# Patient Record
Sex: Female | Born: 1980 | Hispanic: Yes | Marital: Married | Smoking: Never smoker
Health system: Southern US, Community
[De-identification: ages and names within clinical notes are randomized; demographics above are authoritative.]

## PROBLEM LIST (undated history)

## (undated) DIAGNOSIS — E079 Disorder of thyroid, unspecified: Secondary | ICD-10-CM

## (undated) DIAGNOSIS — Z789 Other specified health status: Secondary | ICD-10-CM

## (undated) HISTORY — PX: NO PAST SURGERIES: SHX2092

## (undated) HISTORY — DX: Disorder of thyroid, unspecified: E07.9

## (undated) HISTORY — PX: TUBAL LIGATION: SHX77

---

## 2011-07-05 ENCOUNTER — Emergency Department: Payer: Self-pay | Admitting: Emergency Medicine

## 2016-03-05 ENCOUNTER — Encounter: Payer: Self-pay | Admitting: Emergency Medicine

## 2016-03-05 ENCOUNTER — Inpatient Hospital Stay
Admission: EM | Admit: 2016-03-05 | Discharge: 2016-03-16 | DRG: 872 | Disposition: A | Discharge status: Other Acute Inpatient Hospital | Payer: Medicaid Other | Attending: Internal Medicine | Admitting: Internal Medicine

## 2016-03-05 DIAGNOSIS — E872 Acidosis: Secondary | ICD-10-CM | POA: Diagnosis present

## 2016-03-05 DIAGNOSIS — D649 Anemia, unspecified: Secondary | ICD-10-CM | POA: Diagnosis present

## 2016-03-05 DIAGNOSIS — N390 Urinary tract infection, site not specified: Secondary | ICD-10-CM | POA: Diagnosis present

## 2016-03-05 DIAGNOSIS — N739 Female pelvic inflammatory disease, unspecified: Secondary | ICD-10-CM | POA: Diagnosis present

## 2016-03-05 DIAGNOSIS — K567 Ileus, unspecified: Secondary | ICD-10-CM | POA: Diagnosis present

## 2016-03-05 DIAGNOSIS — A419 Sepsis, unspecified organism: Principal | ICD-10-CM | POA: Diagnosis present

## 2016-03-05 DIAGNOSIS — B9620 Unspecified Escherichia coli [E. coli] as the cause of diseases classified elsewhere: Secondary | ICD-10-CM | POA: Diagnosis present

## 2016-03-05 DIAGNOSIS — R188 Other ascites: Secondary | ICD-10-CM | POA: Diagnosis present

## 2016-03-05 DIAGNOSIS — N888 Other specified noninflammatory disorders of cervix uteri: Secondary | ICD-10-CM

## 2016-03-05 DIAGNOSIS — R946 Abnormal results of thyroid function studies: Secondary | ICD-10-CM | POA: Diagnosis present

## 2016-03-05 DIAGNOSIS — R739 Hyperglycemia, unspecified: Secondary | ICD-10-CM | POA: Diagnosis present

## 2016-03-05 DIAGNOSIS — E871 Hypo-osmolality and hyponatremia: Secondary | ICD-10-CM | POA: Diagnosis present

## 2016-03-05 DIAGNOSIS — Z1612 Extended spectrum beta lactamase (ESBL) resistance: Secondary | ICD-10-CM | POA: Diagnosis present

## 2016-03-05 DIAGNOSIS — Z9851 Tubal ligation status: Secondary | ICD-10-CM | POA: Diagnosis not present

## 2016-03-05 DIAGNOSIS — Z1624 Resistance to multiple antibiotics: Secondary | ICD-10-CM | POA: Diagnosis present

## 2016-03-05 DIAGNOSIS — R1084 Generalized abdominal pain: Secondary | ICD-10-CM

## 2016-03-05 DIAGNOSIS — R109 Unspecified abdominal pain: Secondary | ICD-10-CM

## 2016-03-05 DIAGNOSIS — N10 Acute tubulo-interstitial nephritis: Secondary | ICD-10-CM | POA: Diagnosis present

## 2016-03-05 HISTORY — DX: Other specified health status: Z78.9

## 2016-03-05 LAB — CBC
HEMATOCRIT: 33.1 % — AB (ref 35.0–47.0)
HEMOGLOBIN: 11.5 g/dL — AB (ref 12.0–16.0)
MCH: 29.6 pg (ref 26.0–34.0)
MCHC: 34.7 g/dL (ref 32.0–36.0)
MCV: 85.2 fL (ref 80.0–100.0)
Platelets: 362 10*3/uL (ref 150–440)
RBC: 3.89 MIL/uL (ref 3.80–5.20)
RDW: 13.6 % (ref 11.5–14.5)
WBC: 22.3 10*3/uL — AB (ref 3.6–11.0)

## 2016-03-05 LAB — COMPREHENSIVE METABOLIC PANEL
ALT: 47 U/L (ref 14–54)
AST: 26 U/L (ref 15–41)
Albumin: 2.9 g/dL — ABNORMAL LOW (ref 3.5–5.0)
Alkaline Phosphatase: 106 U/L (ref 38–126)
Anion gap: 10 (ref 5–15)
BUN: 8 mg/dL (ref 6–20)
CHLORIDE: 100 mmol/L — AB (ref 101–111)
CO2: 25 mmol/L (ref 22–32)
Calcium: 8.5 mg/dL — ABNORMAL LOW (ref 8.9–10.3)
Creatinine, Ser: 0.89 mg/dL (ref 0.44–1.00)
Glucose, Bld: 146 mg/dL — ABNORMAL HIGH (ref 65–99)
POTASSIUM: 4 mmol/L (ref 3.5–5.1)
Sodium: 135 mmol/L (ref 135–145)
Total Bilirubin: 0.3 mg/dL (ref 0.3–1.2)
Total Protein: 7.3 g/dL (ref 6.5–8.1)

## 2016-03-05 LAB — URINALYSIS COMPLETE WITH MICROSCOPIC (ARMC ONLY)
Glucose, UA: NEGATIVE mg/dL
Ketones, ur: NEGATIVE mg/dL
Nitrite: POSITIVE — AB
PH: 6 (ref 5.0–8.0)
Specific Gravity, Urine: 1.028 (ref 1.005–1.030)

## 2016-03-05 LAB — POCT PREGNANCY, URINE: Preg Test, Ur: NEGATIVE

## 2016-03-05 LAB — LACTIC ACID, PLASMA: LACTIC ACID, VENOUS: 1.6 mmol/L (ref 0.5–1.9)

## 2016-03-05 LAB — LIPASE, BLOOD: LIPASE: 13 U/L (ref 11–51)

## 2016-03-05 LAB — MRSA PCR SCREENING: MRSA by PCR: NEGATIVE

## 2016-03-05 MED ORDER — ACETAMINOPHEN 650 MG RE SUPP
650.0000 mg | Freq: Four times a day (QID) | RECTAL | Status: DC | PRN
  Administered 2016-03-15: 650 mg via RECTAL
  Filled 2016-03-05: qty 1

## 2016-03-05 MED ORDER — DEXTROSE 5 % IV SOLN
1.0000 g | Freq: Once | INTRAVENOUS | Status: DC
  Filled 2016-03-05: qty 10

## 2016-03-05 MED ORDER — ENOXAPARIN SODIUM 40 MG/0.4ML ~~LOC~~ SOLN
40.0000 mg | SUBCUTANEOUS | Status: DC
  Administered 2016-03-05 – 2016-03-06 (×2): 40 mg via SUBCUTANEOUS
  Filled 2016-03-05 (×2): qty 0.4

## 2016-03-05 MED ORDER — SODIUM CHLORIDE 0.9% FLUSH
3.0000 mL | Freq: Two times a day (BID) | INTRAVENOUS | Status: DC
  Administered 2016-03-05 – 2016-03-15 (×11): 3 mL via INTRAVENOUS

## 2016-03-05 MED ORDER — DEXTROSE 5 % IV SOLN
2.0000 g | Freq: Once | INTRAVENOUS | Status: AC
  Administered 2016-03-05: 2 g via INTRAVENOUS
  Filled 2016-03-05: qty 2

## 2016-03-05 MED ORDER — ACETAMINOPHEN 325 MG PO TABS: 650.0000 mg | ORAL_TABLET | Freq: Four times a day (QID) | ORAL | Status: DC | PRN

## 2016-03-05 MED ORDER — SODIUM CHLORIDE 0.9 % IV SOLN
INTRAVENOUS | Status: DC
  Administered 2016-03-05: 19:00:00 via INTRAVENOUS

## 2016-03-05 MED ORDER — OXYCODONE HCL 5 MG PO TABS
5.0000 mg | ORAL_TABLET | ORAL | Status: DC | PRN
  Administered 2016-03-05 – 2016-03-15 (×16): 5 mg via ORAL
  Filled 2016-03-05 (×17): qty 1

## 2016-03-05 MED ORDER — SODIUM CHLORIDE 0.9 % IV BOLUS (SEPSIS)
500.0000 mL | Freq: Once | INTRAVENOUS | Status: AC
  Administered 2016-03-05: 500 mL via INTRAVENOUS

## 2016-03-05 MED ORDER — KETOROLAC TROMETHAMINE 30 MG/ML IJ SOLN
15.0000 mg | Freq: Once | INTRAMUSCULAR | Status: AC
  Administered 2016-03-05: 15 mg via INTRAVENOUS
  Filled 2016-03-05: qty 1

## 2016-03-05 MED ORDER — DEXTROSE 5 % IV SOLN
1.0000 g | INTRAVENOUS | Status: DC
  Filled 2016-03-05: qty 10

## 2016-03-05 MED ORDER — MORPHINE SULFATE (PF) 2 MG/ML IV SOLN
2.0000 mg | INTRAVENOUS | Status: DC | PRN
  Administered 2016-03-05 – 2016-03-16 (×18): 2 mg via INTRAVENOUS
  Filled 2016-03-05 (×19): qty 1

## 2016-03-05 MED ORDER — SODIUM CHLORIDE 0.9 % IV BOLUS (SEPSIS)
1000.0000 mL | Freq: Once | INTRAVENOUS | Status: AC
  Administered 2016-03-05: 1000 mL via INTRAVENOUS

## 2016-03-05 NOTE — Progress Notes (Signed)
Pharmacy Antibiotic Note  Pamela [name] is a 35 y.o. female admitted on 03/05/2016 with sepsis/UTI.  Pharmacy has been consulted for ceftriaxone dosing.  Plan: Given 2g of Ceftriaxone x1 in the ED. Will order ceftriaxone 1g Q24hr for possibly UTI Sepsis. Re-evaluate to de-escalate abx therapy when appropriate.   Height: 5\' 2"  (157.5 cm) Weight: 110 lb (49.9 kg) IBW/kg (Calculated) : 50.1  Temp (24hrs), Avg:98.6 F (37 C), Min:98.6 F (37 C), Max:98.6 F (37 C)   Recent Labs Lab 03/05/16 1710  WBC 22.3*  CREATININE 0.89    Estimated Creatinine Clearance: 68.8 mL/min (by C-G formula based on SCr of 0.89 mg/dL).    No Known Allergies  Antimicrobials this admission: Ceftriaxone 8/22 >>    Microbiology results: 8/22 BCx: in process 8/22 UCx: in process  Thank you for allowing pharmacy to be a part of this patient's care.  Delsa BernKelly m Orpha Dain 03/05/2016 6:44 PM

## 2016-03-05 NOTE — ED Triage Notes (Signed)
States lower abdominal pain, nausea and vomiting but no diarrhea x 9 days. Denies fevers. States does have pain with urination. Denies being pregnant.

## 2016-03-05 NOTE — H&P (Signed)
[facility] - [place] at [facility]   PATIENT NAME: Pamela [name]    MR#:  [id number]  DATE OF BIRTH:  01/19/1981   DATE OF ADMISSION:  03/05/2016  PRIMARY CARE PHYSICIAN: No PCP Per Patient   REQUESTING/REFERRING PHYSICIAN: Inocencio HomesGayle  CHIEF COMPLAINT:   Chief Complaint  Patient presents with  . Abdominal Pain    HISTORY OF PRESENT ILLNESS:  Pamela [name]  is a 35 y.o. female Spanish-speaking female without significant prior medical history presenting with abdominal pain and fever. She states that she's been having abdominal pain suprapubic in location described only as pain intensity 8/10 no worsening or relieving factors nonradiating associated dysuria as well as fevers, chills, nausea vomiting of nonbloody nonbilious emesis, denies any diarrhea or constipation. Emergency Department patient meeting septic criteria code sepsis initiated  PAST MEDICAL HISTORY:   Past Medical History:  Diagnosis Date  . Medical history non-contributory     PAST SURGICAL HISTORY:   Past Surgical History:  Procedure Laterality Date  . NO PAST SURGERIES      SOCIAL HISTORY:   Social History  Substance Use Topics  . Smoking status: Never Smoker  . Smokeless tobacco: Never Used  . Alcohol use No    FAMILY HISTORY:   Family History  Problem Relation Age of Onset  . Diabetes Neg Hx     DRUG ALLERGIES:  No Known Allergies  REVIEW OF SYSTEMS:  REVIEW OF SYSTEMS:  CONSTITUTIONAL:Positive fevers, chills, fatigue, weakness.  EYES: Denies blurred vision, double vision, or eye pain.  EARS, NOSE, THROAT: Denies tinnitus, ear pain, hearing loss.  RESPIRATORY: denies cough, shortness of breath, wheezing  CARDIOVASCULAR: Denies chest pain, palpitations, edema.  GASTROINTESTINAL: Denies nausea, vomiting, diarrhea, abdominal pain.  GENITOURINARY:Positive dysuria , denies hematuria.  ENDOCRINE: Denies nocturia or thyroid problems. HEMATOLOGIC AND LYMPHATIC:  Denies easy bruising or bleeding.  SKIN: Denies rash or lesions.  MUSCULOSKELETAL: Denies pain in neck, back, shoulder, knees, hips, or further arthritic symptoms.  NEUROLOGIC: Denies paralysis, paresthesias.  PSYCHIATRIC: Denies anxiety or depressive symptoms. Otherwise full review of systems performed by me is negative.   MEDICATIONS AT HOME:   Prior to Admission medications   Not on File      VITAL SIGNS:  Blood pressure (!) 90/58, pulse (!) 117, temperature 98.6 F (37 C), temperature source Oral, resp. rate 20, height 5\' 2"  (1.575 m), weight 49.9 kg (110 lb), last menstrual period 02/05/2016, SpO2 100 %.  PHYSICAL EXAMINATION:  VITAL SIGNS: Vitals:   03/05/16 1706  BP: (!) 90/58  Pulse: (!) 117  Resp: 20  Temp: 98.6 F (37 C)   GENERAL:35 y.o.female currently ill appearing HEAD: Normocephalic, atraumatic.  EYES: Pupils equal, round, reactive to light. Extraocular muscles intact. No scleral icterus.  MOUTH: Moist mucosal membrane. Dentition intact. No abscess noted.  EAR, NOSE, THROAT: Clear without exudates. No external lesions.  NECK: Supple. No thyromegaly. No nodules. No JVD.  PULMONARY: Clear to ascultation, without wheeze rails or rhonci. No use of accessory muscles, Good respiratory effort. good air entry bilaterally CHEST: Nontender to palpation.  CARDIOVASCULAR: S1 and S2. tachycardic. No murmurs, rubs, or gallops. No edema. Pedal pulses 2+ bilaterally.  GASTROINTESTINAL: Soft, suprapubic tenderness without reboud, nondistended. No masses. Positive bowel sounds. No hepatosplenomegaly.  MUSCULOSKELETAL: No swelling, clubbing, or edema. Range of motion full in all extremities.  NEUROLOGIC: Cranial nerves II through XII are intact. No gross focal neurological deficits. Sensation intact. Reflexes intact.  SKIN: No ulceration,  lesions, rashes, or cyanosis. Skin warm and dry. Turgor intact.  PSYCHIATRIC: Mood, affect within normal limits. The patient is awake, alert  and oriented x 3. Insight, judgment intact.    LABORATORY PANEL:   CBC  Recent Labs Lab 03/05/16 1710  WBC 22.3*  HGB 11.5*  HCT 33.1*  PLT 362   ------------------------------------------------------------------------------------------------------------------  Chemistries   Recent Labs Lab 03/05/16 1710  NA 135  K 4.0  CL 100*  CO2 25  GLUCOSE 146*  BUN 8  CREATININE 0.89  CALCIUM 8.5*  AST 26  ALT 47  ALKPHOS 106  BILITOT 0.3   ------------------------------------------------------------------------------------------------------------------  Cardiac Enzymes No results for input(s): TROPONINI in the last 168 hours. ------------------------------------------------------------------------------------------------------------------  RADIOLOGY:  No results found.  EKG:  No orders found for this or any previous visit.  IMPRESSION AND PLAN:   35 year old Hispanic female without significant medical history presenting with abdominal pain and fever  1.Sepsis, meeting septic criteria by temperature, leukocytosis, heart rate present on arrival. Source urinary tract infection Code sepsis initiated. Panculture. Broad-spectrum antibiotics including ceftriaxone and taper antibiotics when culture data returns.  Has received a 30 mL/kg IV fluid bolus. Continue IV fluid hydration to keep mean arterial pressure greater than 65. may require pressor therapy if blood pressure worsens. We will repeat lactic acid if the initial is greater than 2.2.   Given tenuous blood pressure admit to stepdown, I suspect her blood pressure is normally on the lower side as she is a smaller individual  All the records are reviewed and case discussed with ED provider. Management plans discussed with the patient, family and they are in agreement.  CODE STATUS: Full  TOTAL TIME TAKING CARE OF THIS PATIENT: 33 minutes.    Sherie Dobrowolski,  Mardi MainlandDavid K M.D on 03/05/2016 at 6:49 PM  Between 7am to 6pm - Pager -  [phone]  After 6pm: House Pager: - [phone]  [facility] [place] [facility]  Office  [phone]  CC: Primary care physician; No PCP Per Patient

## 2016-03-05 NOTE — ED Notes (Signed)
Code  Sepsis  Called  To  Doug  At  [facility] 

## 2016-03-05 NOTE — ED Provider Notes (Signed)
Pamela Pamela [name] 03/05/16 1752     (approximate)  I have reviewed the triage vital signs and the nursing notes.   HISTORY  Chief Complaint Abdominal Pain    HPI Pamela Pamela [name] is a 35 y.o. female with no chronic medical problems who presents for evaluation of 9 days of suprapubic abdominal pain, dysuria and urinary hesitancy, change in the color of her urine as well as subjective fevers, chills, nausea and vomiting, gradual onset, constant, severe, no modifying factors. She has had a small amount of nonbloody diarrhea. No chest pain or difficulty breathing. She denies any hematuria.    Past Medical History:  Diagnosis Date  . Medical history non-contributory     Pamela [name] Active Problem List   Diagnosis Date Noted  . Sepsis (HCC) 03/05/2016    Past Surgical History:  Procedure Laterality Date  . NO PAST SURGERIES      Prior to Admission medications   Not on File    Allergies Review of Pamela [name] allergies indicates no known allergies.  Family History  Problem Relation Age of Onset  . Diabetes Neg Hx     Social History Social History  Substance Use Topics  . Smoking status: Never Smoker  . Smokeless tobacco: Never Used  . Alcohol use No    Review of Systems Constitutional: + fever/chills Eyes: No visual changes. ENT: No sore throat. Cardiovascular: Denies chest pain. Respiratory: Denies shortness of breath. Gastrointestinal: + abdominal pain.  + nausea, + vomiting.  + diarrhea.  No constipation. Genitourinary: Negative for dysuria. Musculoskeletal: Negative for back pain. Skin: Negative for rash. Neurological: Negative for headaches, focal weakness or numbness.  10-point ROS otherwise negative.  ____________________________________________   PHYSICAL EXAM:   VITAL SIGNS: ED Triage Vitals  Enc  Vitals Group     BP 03/05/16 1706 (!) 90/58     Pulse Rate 03/05/16 1706 (!) 117     Resp 03/05/16 1706 20     Temp 03/05/16 1706 98.6 F (37 C)     Temp Source 03/05/16 1706 Oral     SpO2 03/05/16 1706 100 %     Weight 03/05/16 1707 110 lb (49.9 kg)     Height 03/05/16 1707 5\' 2"  (1.575 m)     Head Circumference --      Peak Flow --      Pain Score 03/05/16 1707 10     Pain Loc --      Pain Edu? --      Excl. in GC? --     Constitutional: Alert and oriented. Fatigued but nontoxic-appearing and in no acute distress. Eyes: Conjunctivae are normal. PERRL. EOMI. Head: Atraumatic. Nose: No congestion/rhinnorhea. Mouth/Throat: Mucous membranes are moist.  Oropharynx non-erythematous. Neck: No stridor. Supple without meningismus. Cardiovascular: Tachycardic rate, regular rhythm. Grossly normal heart sounds.  Good peripheral circulation. Respiratory: Normal respiratory effort.  No retractions. Lungs CTAB. Gastrointestinal: Soft with mild superpubic tenderness to palpation, no rebound or guarding. No CVA tenderness. Genitourinary: deferred Musculoskeletal: No lower extremity tenderness nor edema.  No joint effusions. Neurologic:  Normal speech and language. No gross focal neurologic deficits are appreciated. No gait instability. Skin:  Skin is warm, dry and intact. No rash noted. Psychiatric: Mood and affect are normal. Speech and behavior are normal.  ____________________________________________   LABS (all labs ordered are listed, but only abnormal results are displayed)  Labs Reviewed  COMPREHENSIVE  METABOLIC PANEL - Abnormal; Notable for the following:       Result Value   Chloride 100 (*)    Glucose, Bld 146 (*)    Calcium 8.5 (*)    Albumin 2.9 (*)    All other components within normal limits  CBC - Abnormal; Notable for the following:    WBC 22.3 (*)    Hemoglobin 11.5 (*)    HCT 33.1 (*)    All other components within normal limits  URINALYSIS COMPLETEWITH  MICROSCOPIC ([facility] ONLY) - Abnormal; Notable for the following:    Color, Urine AMBER (*)    APPearance CLOUDY (*)    Bilirubin Urine 1+ (*)    Hgb urine dipstick 1+ (*)    Protein, ur >500 (*)    Nitrite POSITIVE (*)    Leukocytes, UA 1+ (*)    Bacteria, UA RARE (*)    Squamous Epithelial / LPF 6-30 (*)    All other components within normal limits  MRSA PCR SCREENING  CULTURE, BLOOD (ROUTINE X 2)  CULTURE, BLOOD (ROUTINE X 2)  URINE CULTURE  LIPASE, BLOOD  LACTIC ACID, PLASMA  BASIC METABOLIC PANEL  CBC  POC URINE PREG, ED  POCT PREGNANCY, URINE   ____________________________________________  EKG  none ____________________________________________  RADIOLOGY  none ____________________________________________   PROCEDURES  Procedure(s) performed: None  Procedures  Critical Care performed: Yes, see critical care note(s).  CRITICAL CARE Performed by: Toney RakesGayle, Enola Siebers A   Total critical care time: 35 minutes  Critical care time was exclusive of separately billable procedures and treating other patients.  Critical care was necessary to treat or prevent imminent or life-threatening deterioration.  Critical care was time spent personally by me on the following activities: development of treatment plan with Pamela [name] and/or surrogate as well as nursing, discussions with consultants, evaluation of Pamela [name] response to treatment, examination of Pamela [name], obtaining history from Pamela [name] or surrogate, ordering and performing treatments and interventions, ordering and review of laboratory studies, ordering and review of radiographic studies, pulse oximetry and re-evaluation of Pamela [name] condition.  ____________________________________________   INITIAL IMPRESSION / ASSESSMENT AND PLAN / ED COURSE  Pertinent labs & imaging results that were available during my care of the Pamela [name] were reviewed by me and considered in my medical decision making (see chart for details).  [facility] Hospital[facility]  [place] Pamela [name] is a 35 y.o. female with no chronic medical problems who presents for evaluation of 9 days of suprapubic abdominal pain, dysuria and urinary hesitancy as well as fevers and chills. On exam she is fatigued but nontoxic-appearing. She is tachycardic with leukocytosis, white blood cell count elevated at 22,000 meeting 2 out of 4 Sirs criteria. Urinalysis is concerning for nitrite positive urinary tract infection. Concern for UTI sepsis, code sepsis initiated, will give liberal IV fluids, ceftriaxone and discussed the case with the hospitalist for admission.  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 I discussed the case with the hospitalist for admission at this time.  Clinical Course     ____________________________________________   FINAL CLINICAL IMPRESSION(S) / ED DIAGNOSES  Final diagnoses:  Sepsis secondary to UTI (HCC)      NEW MEDICATIONS STARTED DURING THIS VISIT:  There are no discharge medications for this Pamela [name].    Note:  This document was prepared using Conservation officer, historic buildingsDragon voice recognition software and  may include unintentional dictation errors.    Gayla DossEryka A Ahlaya Ende, MD 03/05/16 2352

## 2016-03-06 ENCOUNTER — Inpatient Hospital Stay: Payer: Medicaid Other

## 2016-03-06 ENCOUNTER — Encounter: Payer: Self-pay | Admitting: Radiology

## 2016-03-06 LAB — CBC
HCT: 30.3 % — ABNORMAL LOW (ref 35.0–47.0)
Hemoglobin: 10.5 g/dL — ABNORMAL LOW (ref 12.0–16.0)
MCH: 29.6 pg (ref 26.0–34.0)
MCHC: 34.6 g/dL (ref 32.0–36.0)
MCV: 85.7 fL (ref 80.0–100.0)
PLATELETS: 318 10*3/uL (ref 150–440)
RBC: 3.54 MIL/uL — ABNORMAL LOW (ref 3.80–5.20)
RDW: 13.8 % (ref 11.5–14.5)
WBC: 25.7 10*3/uL — AB (ref 3.6–11.0)

## 2016-03-06 LAB — GLUCOSE, CAPILLARY: Glucose-Capillary: 127 mg/dL — ABNORMAL HIGH (ref 65–99)

## 2016-03-06 LAB — BASIC METABOLIC PANEL
Anion gap: 6 (ref 5–15)
BUN: 8 mg/dL (ref 6–20)
CHLORIDE: 109 mmol/L (ref 101–111)
CO2: 21 mmol/L — AB (ref 22–32)
CREATININE: 0.7 mg/dL (ref 0.44–1.00)
Calcium: 7.1 mg/dL — ABNORMAL LOW (ref 8.9–10.3)
GFR calc Af Amer: >60 mL/min (ref >60)
GFR calc non Af Amer: >60 mL/min (ref >60)
GLUCOSE: 127 mg/dL — AB (ref 65–99)
Potassium: 3.8 mmol/L (ref 3.5–5.1)
SODIUM: 136 mmol/L (ref 135–145)

## 2016-03-06 LAB — HEMOGLOBIN A1C: Hgb A1c MFr Bld: 5.2 % (ref 4.0–6.0)

## 2016-03-06 MED ORDER — DEXTROSE 5 % IV SOLN
2.0000 g | INTRAVENOUS | Status: DC
  Filled 2016-03-06: qty 2

## 2016-03-06 MED ORDER — DEXTROSE 5 % IV SOLN: 1.0000 g | INTRAVENOUS | Status: DC

## 2016-03-06 MED ORDER — SODIUM CHLORIDE 0.9 % IV SOLN
1.0000 g | Freq: Three times a day (TID) | INTRAVENOUS | Status: DC
  Administered 2016-03-06 – 2016-03-16 (×29): 1 g via INTRAVENOUS
  Filled 2016-03-06 (×33): qty 1

## 2016-03-06 MED ORDER — IOPAMIDOL (ISOVUE-300) INJECTION 61%
100.0000 mL | Freq: Once | INTRAVENOUS | Status: AC | PRN
Start: 2016-03-06 — End: 2016-03-06
  Administered 2016-03-06: 100 mL via INTRAVENOUS

## 2016-03-06 MED ORDER — SODIUM CHLORIDE 0.9 % IV BOLUS (SEPSIS)
1000.0000 mL | Freq: Once | INTRAVENOUS | Status: AC
  Administered 2016-03-06: 1000 mL via INTRAVENOUS

## 2016-03-06 MED ORDER — POTASSIUM CHLORIDE IN NACL 20-0.9 MEQ/L-% IV SOLN
INTRAVENOUS | Status: DC
  Administered 2016-03-06 – 2016-03-16 (×22): via INTRAVENOUS
  Filled 2016-03-06 (×32): qty 1000

## 2016-03-06 MED ORDER — DOXYCYCLINE HYCLATE 100 MG PO TABS
100.0000 mg | ORAL_TABLET | Freq: Two times a day (BID) | ORAL | Status: DC
  Administered 2016-03-07 (×2): 100 mg via ORAL
  Filled 2016-03-06 (×2): qty 1

## 2016-03-06 MED ORDER — DIATRIZOATE MEGLUMINE & SODIUM 66-10 % PO SOLN
15.0000 mL | ORAL | Status: AC
  Administered 2016-03-06 (×2): 15 mL via ORAL

## 2016-03-06 MED ORDER — STERILE WATER FOR INJECTION IV SOLN
INTRAVENOUS | Status: DC
  Administered 2016-03-06 – 2016-03-11 (×5): via INTRAVENOUS
  Filled 2016-03-06 (×12): qty 850

## 2016-03-06 NOTE — Consult Note (Addendum)
Obstetrics & Gynecology Consult H&P    Consulting Department: Internal Medicine  Consulting Physician: Theodoro Grist MD  Consulting Question: Evaluate for PID   History of Present Illness: Patient is a 35 y.o. currently admitted to the ICU meeting SIRS criteria.  The patient presented to the ED with fevers, abdominal pain located in the suprapubic area, 8/10 in intensity, radiating to the flanks.  Symptoms initially started on 02/26/2016 and progressively worsened.  LMP is 02/05/2016, regular monthly menses.  Not currently sexually active, last intercourse approximately 2 months ago.  No history of gonorrhea or chlamydia.   Does have a history of abnormal paps, was told these would normalize over time (obtained at [facility]).  Patient is also status post prior tubal ligation.  She was previously treated for pyelonephritis at [facility] (E. Coli) in April of 2014.  Review of Systems:10 point review of systems  Past Medical History:  Past Medical History:  Diagnosis Date  . Medical history non-contributory     Past Surgical History:  Past Surgical History:  Procedure Laterality Date  . NO PAST SURGERIES      Gynecologic History: Unclear history of prior abnormal paps, denies history of STI  Obstetric History: G2P2002 TSVD x 2  Family History:  Family History  Problem Relation Age of Onset  . Diabetes Neg Hx     Social History:  Social History   Social History  . Marital status: Married    Spouse name: N/A  . Number of children: N/A  . Years of education: N/A   Occupational History  . Not on file.   Social History Main Topics  . Smoking status: Never Smoker  . Smokeless tobacco: Never Used  . Alcohol use No  . Drug use: Unknown  . Sexual activity: Not on file   Other Topics Concern  . Not on file   Social History Narrative  . No narrative on file    Allergies:  No Known Allergies  Medications: Prior to Admission medications   Not on File     Physical Exam Vitals: Blood pressure 98/73, pulse 96, temperature 98.5 F (36.9 C), temperature source Oral, resp. rate 20, height 5' 2"  (1.575 m), weight 54.4 kg (119 lb 14.9 oz), last menstrual period 02/05/2016, SpO2 90 %. General: NAD HEENT: normocephalic, anicteric Pulmonary: no increased work of breathing Cardiovascular: RRR Abdomen: Suprapubic tenderness Genitourinary: Normal external female genitalia, bimanual exam reveals significant enlargement and firmness to the lower uterine segment and proximal cervix.  There is no appreciable mobility. Extremities: no edema Neurologic: grossly intact Psychiatric: A&O, mood appropriate, affect full  Labs: Results for orders placed or performed during the hospital encounter of 03/05/16 (from the past 72 hour(s))  Lipase, blood     Status: None   Collection Time: 03/05/16  5:10 PM  Result Value Ref Range   Lipase 13 11 - 51 U/L  Comprehensive metabolic panel     Status: Abnormal   Collection Time: 03/05/16  5:10 PM  Result Value Ref Range   Sodium 135 135 - 145 mmol/L   Potassium 4.0 3.5 - 5.1 mmol/L   Chloride 100 (L) 101 - 111 mmol/L   CO2 25 22 - 32 mmol/L   Glucose, Bld 146 (H) 65 - 99 mg/dL   BUN 8 6 - 20 mg/dL   Creatinine, Ser 0.89 0.44 - 1.00 mg/dL   Calcium 8.5 (L) 8.9 - 10.3 mg/dL   Total Protein 7.3 6.5 - 8.1 g/dL   Albumin 2.9 (  L) 3.5 - 5.0 g/dL   AST 26 15 - 41 U/L   ALT 47 14 - 54 U/L   Alkaline Phosphatase 106 38 - 126 U/L   Total Bilirubin 0.3 0.3 - 1.2 mg/dL   GFR calc non Af Amer >60 >60 mL/min   GFR calc Af Amer >60 >60 mL/min    Comment: (NOTE) The eGFR has been calculated using the CKD EPI equation. This calculation has not been validated in all clinical situations. eGFR's persistently <60 mL/min signify possible Chronic Kidney Disease.    Anion gap 10 5 - 15  CBC     Status: Abnormal   Collection Time: 03/05/16  5:10 PM  Result Value Ref Range   WBC 22.3 (H) 3.6 - 11.0 K/uL   RBC 3.89 3.80 -  5.20 MIL/uL   Hemoglobin 11.5 (L) 12.0 - 16.0 g/dL   HCT 33.1 (L) 35.0 - 47.0 %   MCV 85.2 80.0 - 100.0 fL   MCH 29.6 26.0 - 34.0 pg   MCHC 34.7 32.0 - 36.0 g/dL   RDW 13.6 11.5 - 14.5 %   Platelets 362 150 - 440 K/uL  Urinalysis complete, with microscopic     Status: Abnormal   Collection Time: 03/05/16  5:10 PM  Result Value Ref Range   Color, Urine AMBER (A) YELLOW   APPearance CLOUDY (A) CLEAR   Glucose, UA NEGATIVE NEGATIVE mg/dL   Bilirubin Urine 1+ (A) NEGATIVE   Ketones, ur NEGATIVE NEGATIVE mg/dL   Specific Gravity, Urine 1.028 1.005 - 1.030   Hgb urine dipstick 1+ (A) NEGATIVE   pH 6.0 5.0 - 8.0   Protein, ur >500 (A) NEGATIVE mg/dL   Nitrite POSITIVE (A) NEGATIVE   Leukocytes, UA 1+ (A) NEGATIVE   RBC / HPF 6-30 0 - 5 RBC/hpf   WBC, UA TOO NUMEROUS TO COUNT 0 - 5 WBC/hpf   Bacteria, UA RARE (A) NONE SEEN   Squamous Epithelial / LPF 6-30 (A) NONE SEEN  Urine culture     Status: Abnormal (Preliminary result)   Collection Time: 03/05/16  5:10 PM  Result Value Ref Range   Specimen Description URINE, CLEAN CATCH    Special Requests NONE    Culture >=100,000 COLONIES/mL GRAM NEGATIVE RODS (A)    Report Status PENDING   Pregnancy, urine POC     Status: None   Collection Time: 03/05/16  5:20 PM  Result Value Ref Range   Preg Test, Ur NEGATIVE NEGATIVE    Comment:        THE SENSITIVITY OF THIS METHODOLOGY IS >24 mIU/mL   Lactic acid, plasma     Status: None   Collection Time: 03/05/16  5:53 PM  Result Value Ref Range   Lactic Acid, Venous 1.6 0.5 - 1.9 mmol/L  Blood culture (routine x 2)     Status: None (Preliminary result)   Collection Time: 03/05/16  6:10 PM  Result Value Ref Range   Specimen Description BLOOD LEFT ARM    Special Requests      BOTTLES DRAWN AEROBIC AND ANAEROBIC AER 5CC,ANA 4CC   Culture NO GROWTH < 12 HOURS    Report Status PENDING   Blood culture (routine x 2)     Status: None (Preliminary result)   Collection Time: 03/05/16  6:20 PM   Result Value Ref Range   Specimen Description BLOOD LEFT AC    Special Requests BOTTLES DRAWN AEROBIC AND ANAEROBIC AER 4CC,ANA5CC    Culture NO GROWTH < 12  HOURS    Report Status PENDING   MRSA PCR Screening     Status: None   Collection Time: 03/05/16  8:05 PM  Result Value Ref Range   MRSA by PCR NEGATIVE NEGATIVE    Comment:        The GeneXpert MRSA Assay (FDA approved for NASAL specimens only), is one component of a comprehensive MRSA colonization surveillance program. It is not intended to diagnose MRSA infection nor to guide or monitor treatment for MRSA infections.   Glucose, capillary     Status: Abnormal   Collection Time: 03/05/16  8:05 PM  Result Value Ref Range   Glucose-Capillary 127 (H) 65 - 99 mg/dL  Basic metabolic panel     Status: Abnormal   Collection Time: 03/06/16  4:36 AM  Result Value Ref Range   Sodium 136 135 - 145 mmol/L   Potassium 3.8 3.5 - 5.1 mmol/L   Chloride 109 101 - 111 mmol/L   CO2 21 (L) 22 - 32 mmol/L   Glucose, Bld 127 (H) 65 - 99 mg/dL   BUN 8 6 - 20 mg/dL   Creatinine, Ser 0.70 0.44 - 1.00 mg/dL   Calcium 7.1 (L) 8.9 - 10.3 mg/dL   GFR calc non Af Amer >60 >60 mL/min   GFR calc Af Amer >60 >60 mL/min    Comment: (NOTE) The eGFR has been calculated using the CKD EPI equation. This calculation has not been validated in all clinical situations. eGFR's persistently <60 mL/min signify possible Chronic Kidney Disease.    Anion gap 6 5 - 15  CBC     Status: Abnormal   Collection Time: 03/06/16  4:36 AM  Result Value Ref Range   WBC 25.7 (H) 3.6 - 11.0 K/uL   RBC 3.54 (L) 3.80 - 5.20 MIL/uL   Hemoglobin 10.5 (L) 12.0 - 16.0 g/dL   HCT 30.3 (L) 35.0 - 47.0 %   MCV 85.7 80.0 - 100.0 fL   MCH 29.6 26.0 - 34.0 pg   MCHC 34.6 32.0 - 36.0 g/dL   RDW 13.8 11.5 - 14.5 %   Platelets 318 150 - 440 K/uL  Hemoglobin A1c     Status: None   Collection Time: 03/06/16  4:36 AM  Result Value Ref Range   Hgb A1c MFr Bld 5.2 4.0 - 6.0 %     Imaging US Renal  Result Date: 03/06/2016 CLINICAL DATA:  Acute pyelonephritis. EXAM: RENAL / URINARY TRACT ULTRASOUND COMPLETE COMPARISON:  None. FINDINGS: Right Kidney: Length: 10.3 cm. Echogenicity within normal limits. No mass or hydronephrosis visualized. Left Kidney: Length: 9.2 cm. Echogenicity within normal limits. No mass or hydronephrosis visualized. Bladder: Appears normal for degree of bladder distention. Bilateral ureteral jets are noted. Small amount of ascites is noted around the liver and spleen. IMPRESSION: Small amount of ascites is noted. Kidneys appear grossly normal without hydronephrosis or renal obstruction. Electronically Signed   By: Marijo Conception, M.D.   On: 03/06/2016 12:07    Assessment: 35 y.o. [id number] with pylonephritis, non-mobile enlarged lower uterine segment  Plan: 1) SIRS criteria - feel most likely source is acute pyelonephritis given UA findingswith positive nitrites and culture showing gram negative rods.  Speciation pending anticipate E. Coli.  Does have history of previous pyelonephritis at [facility] in April of 2014. - currently on ceftriaxone consider could consider adding gentamycin for synergy if fails to improve clinically - based on clear source, no risk factors for PID (prior tubal eliminating pathway for  ascending infection), I feel PID is low on the differential.  Will send urine gonorrhea and chlamydia cultures.   - If fails to clinically improve consider CT abdomen and pelvis  2) Enlargement of lower uterine segment/proximal cervix - non-mobile on exam, has history of prior abnormal paps although unclear what the abnormality was.  She was told this would resolve in time.  Concern for possible cervical or uterine pathology.  No evidence of hydronephrosis on ultrasound. - gyn ultrasound abdominal and transvaginal

## 2016-03-06 NOTE — Care Management (Signed)
Met with patient and her daughter that speaks fluent English (patient speaks Spanish) to discuss discharge planning. Patient states she has not been able to work for 2 weeks now due to this pain. She states she has a primary care physician but cannot recall their name. She states she cannot afford her medications due to being out of work. I have delivered and explained application/location of both [facility] and [facility]. Patient does not have have health insurance and will need hard copies of Rx to take to med mgt for assistance. RNCM to continue to follow. Patient states she is usually independent will mobility and has transportation.

## 2016-03-06 NOTE — Progress Notes (Signed)
Dr Clint GuyHower notified of ABD CT findings, no changes to orders at this time

## 2016-03-06 NOTE — Progress Notes (Addendum)
Checked to see if GC/CT has resulted and CT abdomen and pelvis results reviewed.  Significant abscess pockets within pelvis and small/rectum bowl.  Until GC/CT results would broaden coverage by adding doxycyline.  Outpatient PID is treated with ceftriaxone and doxycyline.  Preferred inpatient treatment usually involves cefotetan or cefoxitin as preferred cephalosporin.  I still think that given her history of prior tubal ligation ascending infection is relatively unlikely.  TOA also tend to be confined more prominently to fallopian tubes themselves.  Given this I would keep appendicitis on the differential particularly as the abscess collection appears to be most prominent in the right lower quadrant with largest pocket superior to the right adnexa,  and consider general surgery consult.  On broad coverage with meropenem currently

## 2016-03-06 NOTE — Progress Notes (Signed)
Pharmacy Antibiotic Note  Pamela [name] is a 35 y.o. female admitted on 03/05/2016 with sepsis/UTI.  Pharmacy has been consulted for ceftriaxone dosing.  Plan: Ceftriaxone 2 g iv q 24 hours.   Height: 5\' 2"  (157.5 cm) Weight: 119 lb 14.9 oz (54.4 kg) IBW/kg (Calculated) : 50.1  Temp (24hrs), Avg:98.7 F (37.1 C), Min:98 F (36.7 C), Max:99.3 F (37.4 C)   Recent Labs Lab 03/05/16 1710 03/05/16 1753 03/06/16 0436  WBC 22.3*  --  25.7*  CREATININE 0.89  --  0.70  LATICACIDVEN  --  1.6  --     Estimated Creatinine Clearance: 76.9 mL/min (by C-G formula based on SCr of 0.8 mg/dL).    No Known Allergies  Antimicrobials this admission: Ceftriaxone 8/22 >>    Microbiology results: 8/22 BCx: NGTD x 2 8/22 UCx: pending  8/22 MRSA PCR: negative  Thank you for allowing pharmacy to be a part of this patient's care.  Luisa HartChristy, Brea Coleson D 03/06/2016 9:07 AM

## 2016-03-06 NOTE — Progress Notes (Signed)
[facility] [place] [facility]   PATIENT NAME: Pamela NovaMaria Del [place] [name]    MR#:  [id number]  DATE OF BIRTH:  11/19/1980  SUBJECTIVE:  CHIEF COMPLAINT:   Chief Complaint  Patient presents with  . Abdominal Pain  Patient is a 35 year old Spanish female with not significant past medical history who presents to the hospital with complaints of abdominal pain and fever. Pain is described as 8 out of 10 by intensity and suprapubic area, associated with dysuria, nausea, vomiting, fevers and chills, she also admits of flank pain bilaterally, more on the left side. Her urinalysis revealed pyuria, concerning for acute pyelonephritis and patient was admitted for IV antibiotics. Ultrasound of the kidneys was unremarkable. But mild ascites.  Review of Systems  Constitutional: Negative for chills, fever and weight loss.  HENT: Negative for congestion.   Eyes: Negative for blurred vision and double vision.  Respiratory: Negative for cough, sputum production, shortness of breath and wheezing.   Cardiovascular: Negative for chest pain, palpitations, orthopnea, leg swelling and PND.  Gastrointestinal: Positive for abdominal pain, nausea and vomiting. Negative for blood in stool, constipation and diarrhea.  Genitourinary: Positive for flank pain. Negative for dysuria, frequency, hematuria and urgency.  Musculoskeletal: Negative for falls.  Neurological: Negative for dizziness, tremors, focal weakness and headaches.  Endo/Heme/Allergies: Does not bruise/bleed easily.  Psychiatric/Behavioral: Negative for depression. The patient does not have insomnia.     VITAL SIGNS: Blood pressure 99/72, pulse (!) 106, temperature 98.5 F (36.9 C), temperature source Oral, resp. rate (!) 24, height 5\' 2"  (1.575 m), weight 54.4 kg (119 lb 14.9 oz), last menstrual period 02/05/2016, SpO2 94 %.  PHYSICAL EXAMINATION:   GENERAL:  35 y.o.-year-old patient lying in the bed in moderate distress due  to abdominal pain and flank pain, nausea.  EYES: Pupils equal, round, reactive to light and accommodation. No scleral icterus. Extraocular muscles intact.  HEENT: Head atraumatic, normocephalic. Oropharynx and nasopharynx clear.  NECK:  Supple, no jugular venous distention. No thyroid enlargement, no tenderness.  LUNGS: Normal breath sounds bilaterally, no wheezing, rales,rhonchi or crepitation. No use of accessory muscles of respiration.  CARDIOVASCULAR: S1, S2 normal. No murmurs, rubs, or gallops.  ABDOMEN: Soft, tender diffusely but no rebound , but voluntary guarding was noted, nondistended. Bowel sounds present. No organomegaly or mass. Bilateral CVA tenderness on percussion EXTREMITIES: No pedal edema, cyanosis, or clubbing.  NEUROLOGIC: Cranial nerves II through XII are intact. Muscle strength 5/5 in all extremities. Sensation intact. Gait not checked.  PSYCHIATRIC: The patient is alert and oriented x 3.  SKIN: No obvious rash, lesion, or ulcer.   ORDERS/RESULTS REVIEWED:   CBC  Recent Labs Lab 03/05/16 1710 03/06/16 0436  WBC 22.3* 25.7*  HGB 11.5* 10.5*  HCT 33.1* 30.3*  PLT 362 318  MCV 85.2 85.7  MCH 29.6 29.6  MCHC 34.7 34.6  RDW 13.6 13.8   ------------------------------------------------------------------------------------------------------------------  Chemistries   Recent Labs Lab 03/05/16 1710 03/06/16 0436  NA 135 136  K 4.0 3.8  CL 100* 109  CO2 25 21*  GLUCOSE 146* 127*  BUN 8 8  CREATININE 0.89 0.70  CALCIUM 8.5* 7.1*  AST 26  --   ALT 47  --   ALKPHOS 106  --   BILITOT 0.3  --    ------------------------------------------------------------------------------------------------------------------ estimated creatinine clearance is 76.9 mL/min (by C-G formula based on SCr of 0.8 mg/dL). ------------------------------------------------------------------------------------------------------------------ No results for input(s): TSH, T4TOTAL, T3FREE,  THYROIDAB in the last 72 hours.  Invalid input(s): FREET3  Cardiac Enzymes No results for input(s): CKMB, TROPONINI, MYOGLOBIN in the last 168 hours.  Invalid input(s): CK ------------------------------------------------------------------------------------------------------------------ Invalid input(s): POCBNP ---------------------------------------------------------------------------------------------------------------  RADIOLOGY: [place] Renal  Result Date: 03/06/2016 CLINICAL DATA:  Acute pyelonephritis. EXAM: RENAL / URINARY TRACT ULTRASOUND COMPLETE COMPARISON:  None. FINDINGS: Right Kidney: Length: 10.3 cm. Echogenicity within normal limits. No mass or hydronephrosis visualized. Left Kidney: Length: 9.2 cm. Echogenicity within normal limits. No mass or hydronephrosis visualized. Bladder: Appears normal for degree of bladder distention. Bilateral ureteral jets are noted. Small amount of ascites is noted around the liver and spleen. IMPRESSION: Small amount of ascites is noted. Kidneys appear grossly normal without hydronephrosis or renal obstruction. Electronically Signed   By: Lupita RaiderJames  Green Jr, M.D.   On: 03/06/2016 12:07    EKG: No orders found for this or any previous visit.  ASSESSMENT AND PLAN:  Active Problems:   Sepsis (HCC) #1. Sepsis due to urinary tract infection, rule out pelvic inflammatory disease, initiate patient on meropenem, awaiting for urinary cultures, get CT of abdomen and pelvis, may need GYN evaluation. Blood cultures are negative so far, MRSA by PCR is negative, urine culture is pending.  #2,Acute pyelonephritis, continue meropenem, watching white blood cell count cell count, awaiting for urinary culture  #3. Ascites of unclear etiology, questionable pelvic inflammatory disease, get CT of abdomen and pelvis, GYN consultation #4. Leukocytosis, follow with antibiotic therapy #5. Metabolic acidosis, initiate patient on intravenous bicarbonate #6. Anemia, get  Hemoccult #7. Hyperglycemia, get hemoglobin A1c   Management plans discussed with the patient, family and they are in agreement.   DRUG ALLERGIES: No Known Allergies  CODE STATUS:     Code Status Orders        Start     Ordered   03/05/16 1838  Full code  Continuous     03/05/16 1838    Code Status History    Date Active Date Inactive Code Status Order ID Comments User Context   This patient has a current code status but no historical code status.      TOTAL TIME TAKING CARE OF THIS PATIENT: 40 minutes.    Katharina CaperVAICKUTE,Irasema Chalk M.D on 03/06/2016 at 1:58 PM  Between 7am to 6pm - Pager - [phone]  After 6pm go to [email or web address] - password [email or web address] [facility]  [facility]  Office  [phone]  CC: Primary care physician; No PCP Per Patient

## 2016-03-06 NOTE — Progress Notes (Signed)
Pharmacy Antibiotic Note  Pamela [name] is a 35 y.o. female admitted on 03/05/2016 with sepsis/UTI.  Pharmacy has been consulted for meropnem dosing.  Plan: Meropenem 1 g iv q 8 hours.    Height: 5\' 2"  (157.5 cm) Weight: 119 lb 14.9 oz (54.4 kg) IBW/kg (Calculated) : 50.1  Temp (24hrs), Avg:98.7 F (37.1 C), Min:98 F (36.7 C), Max:99.3 F (37.4 C)   Recent Labs Lab 03/05/16 1710 03/05/16 1753 03/06/16 0436  WBC 22.3*  --  25.7*  CREATININE 0.89  --  0.70  LATICACIDVEN  --  1.6  --     Estimated Creatinine Clearance: 76.9 mL/min (by C-G formula based on SCr of 0.8 mg/dL).    No Known Allergies  Antimicrobials this admission: Ceftriaxone 8/22 >> 8/23 Meropenem 8/23 >>   Microbiology results: 8/22 BCx: NGTD x 2 8/22 UCx: pending 8/22 MRSA PCR: negative  Thank you for allowing pharmacy to be a part of this patient's care.  Luisa HartChristy, Sabas Frett D 03/06/2016 11:05 AM

## 2016-03-07 ENCOUNTER — Inpatient Hospital Stay: Payer: Medicaid Other

## 2016-03-07 DIAGNOSIS — A419 Sepsis, unspecified organism: Principal | ICD-10-CM

## 2016-03-07 DIAGNOSIS — R109 Unspecified abdominal pain: Secondary | ICD-10-CM

## 2016-03-07 DIAGNOSIS — R1084 Generalized abdominal pain: Secondary | ICD-10-CM

## 2016-03-07 LAB — URINE CULTURE: Culture: >=100000 — AB

## 2016-03-07 LAB — CBC
HEMATOCRIT: 32.9 % — AB (ref 35.0–47.0)
Hemoglobin: 11.2 g/dL — ABNORMAL LOW (ref 12.0–16.0)
MCH: 28.9 pg (ref 26.0–34.0)
MCHC: 34.1 g/dL (ref 32.0–36.0)
MCV: 84.7 fL (ref 80.0–100.0)
PLATELETS: 397 10*3/uL (ref 150–440)
RBC: 3.88 MIL/uL (ref 3.80–5.20)
RDW: 14.2 % (ref 11.5–14.5)
WBC: 25.8 10*3/uL — ABNORMAL HIGH (ref 3.6–11.0)

## 2016-03-07 LAB — BASIC METABOLIC PANEL
Anion gap: 6 (ref 5–15)
BUN: 10 mg/dL (ref 6–20)
CALCIUM: 7.3 mg/dL — AB (ref 8.9–10.3)
CO2: 25 mmol/L (ref 22–32)
Chloride: 104 mmol/L (ref 101–111)
Creatinine, Ser: 0.59 mg/dL (ref 0.44–1.00)
GLUCOSE: 118 mg/dL — AB (ref 65–99)
Potassium: 4.1 mmol/L (ref 3.5–5.1)
Sodium: 135 mmol/L (ref 135–145)

## 2016-03-07 LAB — CHLAMYDIA/NGC RT PCR (ARMC ONLY)
Chlamydia Tr: NOT DETECTED
N GONORRHOEAE: NOT DETECTED

## 2016-03-07 MED ORDER — HYDROMORPHONE HCL 1 MG/ML IJ SOLN
1.0000 mg | INTRAMUSCULAR | Status: DC | PRN
  Administered 2016-03-07 – 2016-03-14 (×35): 1 mg via INTRAVENOUS
  Filled 2016-03-07 (×36): qty 1

## 2016-03-07 MED ORDER — PROMETHAZINE HCL 25 MG/ML IJ SOLN: 25.0000 mg | Freq: Four times a day (QID) | INTRAMUSCULAR | Status: DC | PRN

## 2016-03-07 MED ORDER — ENSURE ENLIVE PO LIQD: 237.0000 mL | Freq: Two times a day (BID) | ORAL | Status: DC

## 2016-03-07 NOTE — Progress Notes (Signed)
Per dr Elisabeth Pigeonvachhani order a regular diet, pain and nausea medication has been ordered for her.  He will speak with the surgeon about this case.  No further orders at this time

## 2016-03-07 NOTE — Progress Notes (Signed)
Pelvic abscess and ascitis, TOA? Not likely appendix NPO HOld lovenox Coordinate w OB gyn , perc drainage of abscesses and tapping the ascitis would be my recommendation, lap drainage might be another option No immediate surgical intervention Not peritonitis continue A/Bs

## 2016-03-07 NOTE — Progress Notes (Signed)
Patient accompanied to US paracentesis by Clinical research associatewriter.  No s/sx complications following.  Resting quietly in bed, no bleeding

## 2016-03-07 NOTE — Progress Notes (Signed)
Initial Nutrition Assessment  DOCUMENTATION CODES:   Not applicable  INTERVENTION:  -Recommend addition of Ensure Enlive po BID, each supplement provides 350 kcal and 20 grams of protein  NUTRITION DIAGNOSIS:   Inadequate oral intake related to poor appetite, acute illness as evidenced by per patient/family report.  GOAL:   Patient will meet greater than or equal to 90% of their needs  MONITOR:   PO intake, Supplement acceptance, Labs, Weight trends  REASON FOR ASSESSMENT:   Malnutrition Screening Tool    ASSESSMENT:   35 yo female admitted with abdominal pain and fever, acute pyelonephritis, pelvic abscess and ascites  US guided parcentesis attempted today but fluid collections not amenable to drainage  Unable to complete Nutrition-Focused physical exam at this time.   Past Medical History:  Diagnosis Date  . Medical history non-contributory     Diet Order:  Diet regular Room service appropriate? Yes; Fluid consistency: Thin  Energy Intake: recorded po intake  35% on average  Skin:  Reviewed, no issues  Last BM:  8/22   Labs: reviewed  Meds: NS with KCl at 125 ml/hr  Height:   Ht Readings from Last 1 Encounters:  03/05/16 5\' 2"  (1.575 m)    Weight:   Wt Readings from Last 1 Encounters:  03/07/16 118 lb 2.7 oz (53.6 kg)    Filed Weights   03/05/16 1707 03/05/16 2000 03/07/16 0349  Weight: 110 lb (49.9 kg) 119 lb 14.9 oz (54.4 kg) 118 lb 2.7 oz (53.6 kg)    BMI:  Body mass index is 21.61 kg/m.  Estimated Nutritional Needs:   Kcal:  1600-1900 kcals  Protein:  70-90 g  Fluid:  >/= 1.6 L  EDUCATION NEEDS:   No education needs identified at this time  Romelle StarcherCate Jarius Dieudonne MS, RD, LDN [phone] Pager  [phone] Weekend/On-Call Pager

## 2016-03-07 NOTE — Progress Notes (Signed)
Dr Madelon LipsVacchani notified of 8 beat run V Tach

## 2016-03-07 NOTE — Consult Note (Signed)
Surgical Consultation  03/07/2016  [facility] Pamela [name] is an 35 y.o. female.   CC: Abdominal pain  HPI: This is a Spanish-speaking patient in the ICU seen on an emergent basis at the request of prime doc for CT findings of fluid in the abdomen. History is taken from the chart as it is a very early hour and the patient is Spanish-speaking only. No review of systems is possible. A full and more thorough consult can be obtained later this morning as I will discuss this case with Dr. Dahlia Byes.  Past Medical History:  Diagnosis Date  . Medical history non-contributory     Past Surgical History:  Procedure Laterality Date  . NO PAST SURGERIES      Family History  Problem Relation Age of Onset  . Diabetes Neg Hx     Social History:  reports that she has never smoked. She has never used smokeless tobacco. She reports that she does not drink alcohol. Her drug history is not on file.  Allergies: No Known Allergies  Medications reviewed.   Review of Systems:   Review of Systems  Unable to perform ROS: Language     Physical Exam:  BP 109/81 (BP Location: Right Arm)   Pulse 92   Temp 98.1 F (36.7 C) (Oral)   Resp (!) 26   Ht 5' 2"  (1.575 m)   Wt 119 lb 14.9 oz (54.4 kg)   LMP 02/05/2016   SpO2 96%   BMI 21.94 kg/m   Physical Exam  Constitutional: She is well-developed, well-nourished, and in no distress. No distress.  HENT:  Head: Normocephalic and atraumatic.  Eyes: Right eye exhibits no discharge. Left eye exhibits no discharge. No scleral icterus.  Cardiovascular: Normal rate, regular rhythm and normal heart sounds.   Pulmonary/Chest: Effort normal. No respiratory distress.  Abdominal: Soft. She exhibits distension. There is tenderness. There is no rebound and no guarding.  Distended and tympanitic with minimal diffuse tenderness without peritoneal signs  Musculoskeletal: Normal range of motion. She exhibits no edema.  Neurological: She is alert.  Skin: Skin is  warm and dry. No rash noted. She is not diaphoretic. No erythema.  Vitals reviewed.     Results for orders placed or performed during the hospital encounter of 03/05/16 (from the past 48 hour(s))  Lipase, blood     Status: None   Collection Time: 03/05/16  5:10 PM  Result Value Ref Range   Lipase 13 11 - 51 U/L  Comprehensive metabolic panel     Status: Abnormal   Collection Time: 03/05/16  5:10 PM  Result Value Ref Range   Sodium 135 135 - 145 mmol/L   Potassium 4.0 3.5 - 5.1 mmol/L   Chloride 100 (L) 101 - 111 mmol/L   CO2 25 22 - 32 mmol/L   Glucose, Bld 146 (H) 65 - 99 mg/dL   BUN 8 6 - 20 mg/dL   Creatinine, Ser 0.89 0.44 - 1.00 mg/dL   Calcium 8.5 (L) 8.9 - 10.3 mg/dL   Total Protein 7.3 6.5 - 8.1 g/dL   Albumin 2.9 (L) 3.5 - 5.0 g/dL   AST 26 15 - 41 U/L   ALT 47 14 - 54 U/L   Alkaline Phosphatase 106 38 - 126 U/L   Total Bilirubin 0.3 0.3 - 1.2 mg/dL   GFR calc non Af Amer >60 >60 mL/min   GFR calc Af Amer >60 >60 mL/min    Comment: (NOTE) The eGFR has been calculated using  the CKD EPI equation. This calculation has not been validated in all clinical situations. eGFR's persistently <60 mL/min signify possible Chronic Kidney Disease.    Anion gap 10 5 - 15  CBC     Status: Abnormal   Collection Time: 03/05/16  5:10 PM  Result Value Ref Range   WBC 22.3 (H) 3.6 - 11.0 K/uL   RBC 3.89 3.80 - 5.20 MIL/uL   Hemoglobin 11.5 (L) 12.0 - 16.0 g/dL   HCT 33.1 (L) 35.0 - 47.0 %   MCV 85.2 80.0 - 100.0 fL   MCH 29.6 26.0 - 34.0 pg   MCHC 34.7 32.0 - 36.0 g/dL   RDW 13.6 11.5 - 14.5 %   Platelets 362 150 - 440 K/uL  Urinalysis complete, with microscopic     Status: Abnormal   Collection Time: 03/05/16  5:10 PM  Result Value Ref Range   Color, Urine AMBER (A) YELLOW   APPearance CLOUDY (A) CLEAR   Glucose, UA NEGATIVE NEGATIVE mg/dL   Bilirubin Urine 1+ (A) NEGATIVE   Ketones, ur NEGATIVE NEGATIVE mg/dL   Specific Gravity, Urine 1.028 1.005 - 1.030   Hgb urine  dipstick 1+ (A) NEGATIVE   pH 6.0 5.0 - 8.0   Protein, ur >500 (A) NEGATIVE mg/dL   Nitrite POSITIVE (A) NEGATIVE   Leukocytes, UA 1+ (A) NEGATIVE   RBC / HPF 6-30 0 - 5 RBC/hpf   WBC, UA TOO NUMEROUS TO COUNT 0 - 5 WBC/hpf   Bacteria, UA RARE (A) NONE SEEN   Squamous Epithelial / LPF 6-30 (A) NONE SEEN  Urine culture     Status: Abnormal (Preliminary result)   Collection Time: 03/05/16  5:10 PM  Result Value Ref Range   Specimen Description URINE, CLEAN CATCH    Special Requests NONE    Culture >=100,000 COLONIES/mL GRAM NEGATIVE RODS (A)    Report Status PENDING   Pregnancy, urine POC     Status: None   Collection Time: 03/05/16  5:20 PM  Result Value Ref Range   Preg Test, Ur NEGATIVE NEGATIVE    Comment:        THE SENSITIVITY OF THIS METHODOLOGY IS >24 mIU/mL   Lactic acid, plasma     Status: None   Collection Time: 03/05/16  5:53 PM  Result Value Ref Range   Lactic Acid, Venous 1.6 0.5 - 1.9 mmol/L  Blood culture (routine x 2)     Status: None (Preliminary result)   Collection Time: 03/05/16  6:10 PM  Result Value Ref Range   Specimen Description BLOOD LEFT ARM    Special Requests      BOTTLES DRAWN AEROBIC AND ANAEROBIC AER 5CC,ANA 4CC   Culture NO GROWTH < 12 HOURS    Report Status PENDING   Blood culture (routine x 2)     Status: None (Preliminary result)   Collection Time: 03/05/16  6:20 PM  Result Value Ref Range   Specimen Description BLOOD LEFT AC    Special Requests BOTTLES DRAWN AEROBIC AND ANAEROBIC AER 4CC,ANA5CC    Culture NO GROWTH < 12 HOURS    Report Status PENDING   MRSA PCR Screening     Status: None   Collection Time: 03/05/16  8:05 PM  Result Value Ref Range   MRSA by PCR NEGATIVE NEGATIVE    Comment:        The GeneXpert MRSA Assay (FDA approved for NASAL specimens only), is one component of a comprehensive MRSA colonization surveillance program. It  is not intended to diagnose MRSA infection nor to guide or monitor treatment for MRSA  infections.   Glucose, capillary     Status: Abnormal   Collection Time: 03/05/16  8:05 PM  Result Value Ref Range   Glucose-Capillary 127 (H) 65 - 99 mg/dL  Basic metabolic panel     Status: Abnormal   Collection Time: 03/06/16  4:36 AM  Result Value Ref Range   Sodium 136 135 - 145 mmol/L   Potassium 3.8 3.5 - 5.1 mmol/L   Chloride 109 101 - 111 mmol/L   CO2 21 (L) 22 - 32 mmol/L   Glucose, Bld 127 (H) 65 - 99 mg/dL   BUN 8 6 - 20 mg/dL   Creatinine, Ser 0.70 0.44 - 1.00 mg/dL   Calcium 7.1 (L) 8.9 - 10.3 mg/dL   GFR calc non Af Amer >60 >60 mL/min   GFR calc Af Amer >60 >60 mL/min    Comment: (NOTE) The eGFR has been calculated using the CKD EPI equation. This calculation has not been validated in all clinical situations. eGFR's persistently <60 mL/min signify possible Chronic Kidney Disease.    Anion gap 6 5 - 15  CBC     Status: Abnormal   Collection Time: 03/06/16  4:36 AM  Result Value Ref Range   WBC 25.7 (H) 3.6 - 11.0 K/uL   RBC 3.54 (L) 3.80 - 5.20 MIL/uL   Hemoglobin 10.5 (L) 12.0 - 16.0 g/dL   HCT 30.3 (L) 35.0 - 47.0 %   MCV 85.7 80.0 - 100.0 fL   MCH 29.6 26.0 - 34.0 pg   MCHC 34.6 32.0 - 36.0 g/dL   RDW 13.8 11.5 - 14.5 %   Platelets 318 150 - 440 K/uL  Hemoglobin A1c     Status: None   Collection Time: 03/06/16  4:36 AM  Result Value Ref Range   Hgb A1c MFr Bld 5.2 4.0 - 6.0 %   Ct Abdomen Pelvis W Contrast  Result Date: 03/06/2016 CLINICAL DATA:  Dysuria, abdominal pain, fever and suprapubic pain for approximately 10 days. EXAM: CT ABDOMEN AND PELVIS WITH CONTRAST TECHNIQUE: Multidetector CT imaging of the abdomen and pelvis was performed using the standard protocol following bolus administration of intravenous contrast. CONTRAST:  100 ml ISOVUE-300 IOPAMIDOL (ISOVUE-300) INJECTION 61% COMPARISON:  None. FINDINGS: There small bilateral pleural effusions. Associated compressive atelectasis in the lung bases is identified. There is a moderate volume of  free abdominal fluid. Multiple fluid collections are identified in the pelvis. A collection just superior to the right adnexa in the pelvis measures approximately 6.0 cm AP by 3.1 cm transverse by 2.6 cm craniocaudal. A collection anterior to the rectum which extends cephalad into the left pelvis measures approximately 4.5 cm transverse by 2.1 cm AP by 6.3 cm craniocaudal. A septated collection centered in the right adnexa measures 5.2 cm AP x 4.5 cm transverse by approximately 3.4 cm craniocaudal. A third collection in the right lower quadrant measures 3.6 cm transverse by 2.4 cm AP by 2.5 cm craniocaudal. These collections may communicate. There is marked inflammatory change in mesenteric and omental fat in the pelvis. Multiple small bowel loops demonstrate wall thickening and hyperenhancement. Additionally, the walls of the ascending and proximal transverse colon are hyperenhancing and thickened as on the walls of the sigmoid colon. The appendix is hyperenhancing on image 67. The uterus appears normal. No free intraperitoneal air, pneumatosis or portal venous gas is seen. The kidneys demonstrate homogeneous and normal enhancement. No  hydronephrosis is present. The urinary bladder is unremarkable. The gallbladder, liver, spleen pancreas, biliary tree and adrenal glands appear normal. No bony abnormality is identified. IMPRESSION: Constellation of findings most suggestive of pelvic inflammatory disease with tubo-ovarian abscess on the right and extensive abscess formation the abdomen and pelvis. Abnormal appearance of pelvic small bowel loops in the ascending and proximal transverse colon likely reflects secondary inflammation. Missed appendicitis is possible but thought less likely. Normal appearing kidneys.  No CT evidence of pyelonephritis. Small bilateral pleural effusions and compressive basilar atelectasis. Electronically Signed   By: Inge Rise M.D.   On: 03/06/2016 18:25   US Renal  Result Date:  03/06/2016 CLINICAL DATA:  Acute pyelonephritis. EXAM: RENAL / URINARY TRACT ULTRASOUND COMPLETE COMPARISON:  None. FINDINGS: Right Kidney: Length: 10.3 cm. Echogenicity within normal limits. No mass or hydronephrosis visualized. Left Kidney: Length: 9.2 cm. Echogenicity within normal limits. No mass or hydronephrosis visualized. Bladder: Appears normal for degree of bladder distention. Bilateral ureteral jets are noted. Small amount of ascites is noted around the liver and spleen. IMPRESSION: Small amount of ascites is noted. Kidneys appear grossly normal without hydronephrosis or renal obstruction. Electronically Signed   By: Marijo Conception, M.D.   On: 03/06/2016 12:07    Assessment/Plan:  This a patient who is admitted the hospital and to the ICU with sepsis with an elevated white blood cell count signs of Sirs. A CT scan today has been personally reviewed showing fluid in the pelvis and around the adnexa as well as fluid around the liver. The appendix does not appear to be involved but is in the right lower quadrant as expected near the disease fluid collections which are loculated. The official reading suggest pelvic inflammatory disease and TOA but cannot rule out missed appendicitis. Patient is currently afebrile.  This consult was obtained at a very early hour on an emergent basis due to the findings on CT scan suggestive of tubo-ovarian abscess and pelvic inflammatory disease. The GYN consult did not feel that this was actually PID and was concerned about alternative causes and a surgical consult was suggested.  At this point reviewing the CT scan I cannot tell whether this source of fluid is originating from. It could be PID it could be a missed appendix or something else. The small bowel was extremely thickened and inflamed and an acute process and there is fluid up around the liver. With this in mind I would not recommend surgical intervention but a paracentesis or a CT-guided drainage of a  pelvic abscess may make the patient feel better and improve her outcome and help with diagnosis.  Due to early hour I will discuss this patient's care with Dr. Perrin Maltese in the morning. She may also benefit from a nasogastric tube as there is so much inflammatory tissue in the pelvis that the stomach is quite dilated and there is signs of ileus. I made an attempt to Dr. 2 prime doc again this evening but could not reach them in the emergency room.  Florene Glen, MD, FACS

## 2016-03-07 NOTE — Progress Notes (Signed)
Pharmacy Antibiotic Note  Pamela [name] is a 35 y.o. female admitted on 03/05/2016 with sepsis/UTI.  Pharmacy has been consulted for meropnem dosing.  Plan: Doxycycline added for r/o PID. GC negative. After discussion with Dr. Elisabeth [name], will [name]/c doxycycline.  Meropenem 1 g iv q 8 hours.    Height: 5\' 2"  (157.5 cm) Weight: 118 lb 2.7 oz (53.6 kg) IBW/kg (Calculated) : 50.1  Temp (24hrs), Avg:98 F (36.7 C), Min:97.6 F (36.4 C), Max:98.2 F (36.8 C)   Recent Labs Lab 03/05/16 1710 03/05/16 1753 03/06/16 0436 03/07/16 0523  WBC 22.3*  --  25.7* 25.8*  CREATININE 0.89  --  0.70 0.59  LATICACIDVEN  --  1.6  --   --     Estimated Creatinine Clearance: 76.9 mL/min (by C-G formula based on SCr of 0.8 mg/dL).    No Known Allergies  Antimicrobials this admission: Doxycyline 8/24 >> 8/24 Ceftriaxone 8/22 >> 8/23 Meropenem 8/23 >>   Microbiology results: 8/22 BCx: NGTD x 2 8/22 UCx: ESBL E coli 8/22 MRSA PCR: negative 8/24: peritoneal fluid: pending 8/24 GC: negative  Thank you for allowing pharmacy to be a part of this patient's care.  Pamela [name], Pamela [name] 03/07/2016 3:56 PM

## 2016-03-07 NOTE — Progress Notes (Signed)
Contacted by consulting gynecologist, who states that after reviewing CT has some concern for appendix is possible source of patient's multiple abdominal fluid collections. Patient came in today septic with some concern for possible PID. However, per consultants note given the fact that she's had a tubal ligation PID is a less likely etiology. Consult placed for general surgery to see the patient, we'll contact them for the same.  Kristeen MissWILLIS, Jeaneane Adamec FIELDING [facility] 03/07/2016, 12:29 AM

## 2016-03-07 NOTE — Procedures (Signed)
US diagnostic paracentesis.  Multiple fluid collections noted in abdomen but not amenable to drainage.  Complications:  None  Blood Loss: none  See dictation in canopy pacs

## 2016-03-08 DIAGNOSIS — R1030 Lower abdominal pain, unspecified: Secondary | ICD-10-CM

## 2016-03-08 LAB — CBC
HCT: 32.2 % — ABNORMAL LOW (ref 35.0–47.0)
HEMOGLOBIN: 11.3 g/dL — AB (ref 12.0–16.0)
MCH: 29.7 pg (ref 26.0–34.0)
MCHC: 35.1 g/dL (ref 32.0–36.0)
MCV: 84.5 fL (ref 80.0–100.0)
PLATELETS: 410 10*3/uL (ref 150–440)
RBC: 3.82 MIL/uL (ref 3.80–5.20)
RDW: 14.2 % (ref 11.5–14.5)
WBC: 21.1 10*3/uL — ABNORMAL HIGH (ref 3.6–11.0)

## 2016-03-08 MED ORDER — LEVOTHYROXINE SODIUM 100 MCG IV SOLR
50.0000 ug | Freq: Every day | INTRAVENOUS | Status: DC
  Administered 2016-03-08 – 2016-03-09 (×2): 50 ug via INTRAVENOUS
  Filled 2016-03-08 (×3): qty 5

## 2016-03-08 NOTE — Progress Notes (Addendum)
CC: Pelvic abscesses and ascites Subjective: WBC trending down Feels a little bit better. Some intermittent mild to moderate abdominal pain. This is diffuse No emesis. Afebrile  [place] reviewed, Had  paracentesis showing no evidence of succus and Gram stain no organisms, a lot of wbc's  Objective: Vital signs in last 24 hours: Temp:  [97.4 F (36.3 C)-98.1 F (36.7 C)] 97.4 F (36.3 C) (08/25 0800) Pulse Rate:  [59-86] 72 (08/25 0900) Resp:  [10-26] 20 (08/25 0900) BP: (107-128)/(74-90) 107/74 (08/25 0900) SpO2:  [89 %-100 %] 94 % (08/25 0900) Last BM Date: 03/05/16  Intake/Output from previous day: 08/24 0701 - 08/25 0700 In: 4702 [P.O.:477; I.V.:4025; IV Piggyback:200] Out: 560 [Urine:500] Intake/Output this shift: Total I/O In: 587 [P.O.:237; I.V.:350] Out: 200 [Urine:200]  Physical exam: She is non toxic Chsest: CTA, NSR Abd: distended w dec Bs, mild tenderness diffusely, no peritonitis  EXt: well perfused, no edema      Lab Results: CBC   Recent Labs  03/07/16 0523 03/08/16 0938  WBC 25.8* 21.1*  HGB 11.2* 11.3*  HCT 32.9* 32.2*  PLT 397 410   BMET  Recent Labs  03/06/16 0436 03/07/16 0523  NA 136 135  K 3.8 4.1  CL 109 104  CO2 21* 25  GLUCOSE 127* 118*  BUN 8 10  CREATININE 0.70 0.59  CALCIUM 7.1* 7.3*   PT/INR No results for input(s): LABPROT, INR in the last 72 hours. ABG No results for input(s): PHART, HCO3 in the last 72 hours.  Invalid input(s): PCO2, PO2  Studies/Results: Ct Abdomen Pelvis W Contrast  Result Date: 03/06/2016 CLINICAL DATA:  Dysuria, abdominal pain, fever and suprapubic pain for approximately 10 days. EXAM: CT ABDOMEN AND PELVIS WITH CONTRAST TECHNIQUE: Multidetector CT imaging of the abdomen and pelvis was performed using the standard protocol following bolus administration of intravenous contrast. CONTRAST:  100 ml ISOVUE-300 IOPAMIDOL (ISOVUE-300) INJECTION 61% COMPARISON:  None. FINDINGS: There small bilateral  pleural effusions. Associated compressive atelectasis in the lung bases is identified. There is a moderate volume of free abdominal fluid. Multiple fluid collections are identified in the pelvis. A collection just superior to the right adnexa in the pelvis measures approximately 6.0 cm AP by 3.1 cm transverse by 2.6 cm craniocaudal. A collection anterior to the rectum which extends cephalad into the left pelvis measures approximately 4.5 cm transverse by 2.1 cm AP by 6.3 cm craniocaudal. A septated collection centered in the right adnexa measures 5.2 cm AP x 4.5 cm transverse by approximately 3.4 cm craniocaudal. A third collection in the right lower quadrant measures 3.6 cm transverse by 2.4 cm AP by 2.5 cm craniocaudal. These collections may communicate. There is marked inflammatory change in mesenteric and omental fat in the pelvis. Multiple small bowel loops demonstrate wall thickening and hyperenhancement. Additionally, the walls of the ascending and proximal transverse colon are hyperenhancing and thickened as on the walls of the sigmoid colon. The appendix is hyperenhancing on image 67. The uterus appears normal. No free intraperitoneal air, pneumatosis or portal venous gas is seen. The kidneys demonstrate homogeneous and normal enhancement. No hydronephrosis is present. The urinary bladder is unremarkable. The gallbladder, liver, spleen pancreas, biliary tree and adrenal glands appear normal. No bony abnormality is identified. IMPRESSION: Constellation of findings most suggestive of pelvic inflammatory disease with tubo-ovarian abscess on the right and extensive abscess formation the abdomen and pelvis. Abnormal appearance of pelvic small bowel loops in the ascending and proximal transverse colon likely reflects secondary inflammation. Missed  appendicitis is possible but thought less likely. Normal appearing kidneys.  No CT evidence of pyelonephritis. Small bilateral pleural effusions and compressive basilar  atelectasis. Electronically Signed   By: Drusilla Kannerhomas  Dalessio M.D.   On: 03/06/2016 18:25   [place] Renal  Result Date: 03/06/2016 CLINICAL DATA:  Acute pyelonephritis. EXAM: RENAL / URINARY TRACT ULTRASOUND COMPLETE COMPARISON:  None. FINDINGS: Right Kidney: Length: 10.3 cm. Echogenicity within normal limits. No mass or hydronephrosis visualized. Left Kidney: Length: 9.2 cm. Echogenicity within normal limits. No mass or hydronephrosis visualized. Bladder: Appears normal for degree of bladder distention. Bilateral ureteral jets are noted. Small amount of ascites is noted around the liver and spleen. IMPRESSION: Small amount of ascites is noted. Kidneys appear grossly normal without hydronephrosis or renal obstruction. Electronically Signed   By: Lupita RaiderJames  Green Jr, M.D.   On: 03/06/2016 12:07   [place] Paracentesis  Result Date: 03/07/2016 INDICATION: Ascites and multifocal intra-abdominal abscess EXAM: ULTRASOUND GUIDED PARACENTESIS MEDICATIONS: None. ANESTHESIA/SEDATION: None . COMPLICATIONS: None immediate. PROCEDURE: Informed written consent was obtained from the patient after a discussion of the risks, benefits and alternatives to treatment. A timeout was performed prior to the initiation of the procedure. This patient has multiple intra abdominal fluid collections although the majority of these are not amenable to percutaneous drainage. Request was made for paracentesis of the ascites to rule out perforation. Initial ultrasound scanning demonstrates a large amount of ascites within the right lower abdominal quadrant. The right upper abdomen was prepped and draped in the usual sterile fashion. 1% lidocaine with epinephrine was used for local anesthesia. Following this, an 18 gauge spinal needle was introduced under real-time ultrasound guidance. Confirmatory image was obtained. The paracentesis was performed. The needle was removed and a dressing was applied. The patient tolerated the procedure well without immediate  post procedural complication. FINDINGS: A total of approximately 60 mL of cloudy yellow fluid was removed. Samples were sent to the laboratory as requested by the clinical team. IMPRESSION: Successful ultrasound-guided paracentesis yielding 60 mL of peritoneal fluid. Electronically Signed   By: Alcide CleverMark  Lukens M.D.   On: 03/07/2016 13:32    Anti-infectives: Anti-infectives    Start     Dose/Rate Route Frequency Ordered Stop   03/06/16 2330  doxycycline (VIBRA-TABS) tablet 100 mg  Status:  Discontinued     100 mg Oral Every 12 hours 03/06/16 2319 03/07/16 1518   03/06/16 1800  cefTRIAXone (ROCEPHIN) 1 g in dextrose 5 % 50 mL IVPB  Status:  Discontinued     1 g 100 mL/hr over 30 Minutes Intravenous Every 24 hours 03/05/16 1928 03/06/16 0904   03/06/16 1800  cefTRIAXone (ROCEPHIN) 2 g in dextrose 5 % 50 mL IVPB  Status:  Discontinued     2 g 100 mL/hr over 30 Minutes Intravenous Every 24 hours 03/06/16 0904 03/06/16 1059   03/06/16 1800  cefTRIAXone (ROCEPHIN) 1 g in dextrose 5 % 50 mL IVPB  Status:  Discontinued     1 g 100 mL/hr over 30 Minutes Intravenous Every 24 hours 03/06/16 1059 03/06/16 1059   03/06/16 1200  meropenem (MERREM) 1 g in sodium chloride 0.9 % 100 mL IVPB     1 g 200 mL/hr over 30 Minutes Intravenous Every 8 hours 03/06/16 1105     03/05/16 1845  cefTRIAXone (ROCEPHIN) 1 g in dextrose 5 % 50 mL IVPB  Status:  Discontinued     1 g 100 mL/hr over 30 Minutes Intravenous  Once 03/05/16 1833 03/05/16 1840  03/05/16 1845  cefTRIAXone (ROCEPHIN) 2 g in dextrose 5 % 50 mL IVPB     2 g 100 mL/hr over 30 Minutes Intravenous  Once 03/05/16 1840 03/05/16 1919      Assessment/Plan: Pelvic abscesses and ascitics, slowly improving No peritontiis continue A/Bs, keep NPO until her ileus resolves. She has SB dilation and gastric dilation No surgical intervention at this time May need to re scan in 5 days to re-evaluate the collections D/w Dr. Seth Bake in detail  Sterling Big, MD,  [facility]  03/08/2016

## 2016-03-08 NOTE — Progress Notes (Signed)
Pt appeared somewhat more comfortable this shift.  Output markedly improved, [name], urine much lighter in color than yesterday.  Pain was more under control.  Pt seems determined to minimize intake of Dilaudid.  IV injection appears to trigger HA and flushing.

## 2016-03-08 NOTE — H&P (Signed)
Obstetric and Gynecology  Subjective  Still having abdominal pain, no further fevers or chills.  Reports nausea, not passing flatus in the last 3 days  Objective   Vitals:   03/08/16 0500 03/08/16 0600 03/08/16 0800 03/08/16 0900  BP:   112/78 107/74  Pulse: (!) 59 66 66 72  Resp: 10 16  20   Temp:   97.4 F (36.3 C)   TempSrc:   Oral   SpO2: 100% 100%  94%  Weight:      Height:        Intake/Output Summary (Last 24 hours) at 03/08/16 1005 Last data filed at 03/08/16 0900  Gross per 24 hour  Intake             4524 ml  Output              760 ml  Net             3764 ml    General: NAD Pulmonary: No increased work of breathing Abdomen: Hypoactive BS, distended, tympanic to percussion, still significant tenderness BLQ, + rebound and guarding Extremities: SCD's in place  Labs: Results for orders placed or performed during the hospital encounter of 03/05/16 (from the past 24 hour(s))  Chlamydia/NGC rt PCR ([facility] only)     Status: None   Collection Time: 03/07/16 10:54 AM  Result Value Ref Range   Specimen source GC/Chlam URINE, RANDOM    Chlamydia Tr NOT DETECTED NOT DETECTED   N gonorrhoeae NOT DETECTED NOT DETECTED  Body fluid culture     Status: None (Preliminary result)   Collection Time: 03/07/16 12:03 PM  Result Value Ref Range   Specimen Description PERITONEAL    Special Requests NONE    Gram Stain      ABUNDANT WBC PRESENT,BOTH PMN AND MONONUCLEAR NO ORGANISMS SEEN    Culture PENDING    Report Status PENDING   CBC     Status: Abnormal   Collection Time: 03/08/16  9:38 AM  Result Value Ref Range   WBC 21.1 (H) 3.6 - 11.0 K/uL   RBC 3.82 3.80 - 5.20 MIL/uL   Hemoglobin 11.3 (L) 12.0 - 16.0 g/dL   HCT 96.032.2 (L) 45.435.0 - 09.847.0 %   MCV 84.5 80.0 - 100.0 fL   MCH 29.7 26.0 - 34.0 pg   MCHC 35.1 32.0 - 36.0 g/dL   RDW 11.914.2 14.711.5 - 82.914.5 %   Platelets 410 150 - 440 K/uL    Cultures: Results for orders placed or performed during the hospital encounter of  03/05/16  Urine culture     Status: Abnormal   Collection Time: 03/05/16  5:10 PM  Result Value Ref Range Status   Specimen Description URINE, CLEAN CATCH  Final   Special Requests NONE  Final   Culture (A)  Final    >=100,000 COLONIES/mL ESCHERICHIA COLI Confirmed Extended Spectrum Beta-Lactamase Producer (ESBL) Performed at [facility]     Report Status 03/07/2016 FINAL  Final   Organism ID, Bacteria ESCHERICHIA COLI (A)  Final      Susceptibility   Escherichia coli - MIC*    AMPICILLIN >=32 RESISTANT Resistant     CEFAZOLIN >=64 RESISTANT Resistant     CEFTRIAXONE >=64 RESISTANT Resistant     CIPROFLOXACIN >=4 RESISTANT Resistant     GENTAMICIN <=1 SENSITIVE Sensitive     IMIPENEM <=0.25 SENSITIVE Sensitive     NITROFURANTOIN <=16 SENSITIVE Sensitive     TRIMETH/SULFA <=20 SENSITIVE Sensitive  AMPICILLIN/SULBACTAM >=32 RESISTANT Resistant     PIP/TAZO >=128 RESISTANT Resistant     Extended ESBL POSITIVE Resistant     * >=100,000 COLONIES/mL ESCHERICHIA COLI  Blood culture (routine x 2)     Status: None (Preliminary result)   Collection Time: 03/05/16  6:10 PM  Result Value Ref Range Status   Specimen Description BLOOD LEFT ARM  Final   Special Requests   Final    BOTTLES DRAWN AEROBIC AND ANAEROBIC AER 5CC,ANA 4CC   Culture NO GROWTH 3 DAYS  Final   Report Status PENDING  Incomplete  Blood culture (routine x 2)     Status: None (Preliminary result)   Collection Time: 03/05/16  6:20 PM  Result Value Ref Range Status   Specimen Description BLOOD LEFT AC  Final   Special Requests BOTTLES DRAWN AEROBIC AND ANAEROBIC AER 4CC,ANA5CC  Final   Culture NO GROWTH 3 DAYS  Final   Report Status PENDING  Incomplete  MRSA PCR Screening     Status: None   Collection Time: 03/05/16  8:05 PM  Result Value Ref Range Status   MRSA by PCR NEGATIVE NEGATIVE Final    Comment:        The GeneXpert MRSA Assay (FDA approved for NASAL specimens only), is one component of  a comprehensive MRSA colonization surveillance program. It is not intended to diagnose MRSA infection nor to guide or monitor treatment for MRSA infections.   Chlamydia/NGC rt PCR ([facility] only)     Status: None   Collection Time: 03/07/16 10:54 AM  Result Value Ref Range Status   Specimen source GC/Chlam URINE, RANDOM  Final   Chlamydia Tr NOT DETECTED NOT DETECTED Final   N gonorrhoeae NOT DETECTED NOT DETECTED Final    Comment: (NOTE) 100  This methodology has not been evaluated in pregnant women or in 200  patients with a history of hysterectomy. 300 400  This methodology will not be performed on patients less than 61  years of age.   Body fluid culture     Status: None (Preliminary result)   Collection Time: 03/07/16 12:03 PM  Result Value Ref Range Status   Specimen Description PERITONEAL  Final   Special Requests NONE  Final   Gram Stain   Final    ABUNDANT WBC PRESENT,BOTH PMN AND MONONUCLEAR NO ORGANISMS SEEN    Culture PENDING  Incomplete   Report Status PENDING  Incomplete    Imaging: Ct Abdomen Pelvis W Contrast  Result Date: 03/06/2016 CLINICAL DATA:  Dysuria, abdominal pain, fever and suprapubic pain for approximately 10 days. EXAM: CT ABDOMEN AND PELVIS WITH CONTRAST TECHNIQUE: Multidetector CT imaging of the abdomen and pelvis was performed using the standard protocol following bolus administration of intravenous contrast. CONTRAST:  100 ml ISOVUE-300 IOPAMIDOL (ISOVUE-300) INJECTION 61% COMPARISON:  None. FINDINGS: There small bilateral pleural effusions. Associated compressive atelectasis in the lung bases is identified. There is a moderate volume of free abdominal fluid. Multiple fluid collections are identified in the pelvis. A collection just superior to the right adnexa in the pelvis measures approximately 6.0 cm AP by 3.1 cm transverse by 2.6 cm craniocaudal. A collection anterior to the rectum which extends cephalad into the left pelvis measures approximately  4.5 cm transverse by 2.1 cm AP by 6.3 cm craniocaudal. A septated collection centered in the right adnexa measures 5.2 cm AP x 4.5 cm transverse by approximately 3.4 cm craniocaudal. A third collection in the right lower quadrant measures 3.6 cm transverse  by 2.4 cm AP by 2.5 cm craniocaudal. These collections may communicate. There is marked inflammatory change in mesenteric and omental fat in the pelvis. Multiple small bowel loops demonstrate wall thickening and hyperenhancement. Additionally, the walls of the ascending and proximal transverse colon are hyperenhancing and thickened as on the walls of the sigmoid colon. The appendix is hyperenhancing on image 67. The uterus appears normal. No free intraperitoneal air, pneumatosis or portal venous gas is seen. The kidneys demonstrate homogeneous and normal enhancement. No hydronephrosis is present. The urinary bladder is unremarkable. The gallbladder, liver, spleen pancreas, biliary tree and adrenal glands appear normal. No bony abnormality is identified. IMPRESSION: Constellation of findings most suggestive of pelvic inflammatory disease with tubo-ovarian abscess on the right and extensive abscess formation the abdomen and pelvis. Abnormal appearance of pelvic small bowel loops in the ascending and proximal transverse colon likely reflects secondary inflammation. Missed appendicitis is possible but thought less likely. Normal appearing kidneys.  No CT evidence of pyelonephritis. Small bilateral pleural effusions and compressive basilar atelectasis. Electronically Signed   By: Drusilla Kanner M.D.   On: 03/06/2016 18:25   US Renal  Result Date: 03/06/2016 CLINICAL DATA:  Acute pyelonephritis. EXAM: RENAL / URINARY TRACT ULTRASOUND COMPLETE COMPARISON:  None. FINDINGS: Right Kidney: Length: 10.3 cm. Echogenicity within normal limits. No mass or hydronephrosis visualized. Left Kidney: Length: 9.2 cm. Echogenicity within normal limits. No mass or hydronephrosis  visualized. Bladder: Appears normal for degree of bladder distention. Bilateral ureteral jets are noted. Small amount of ascites is noted around the liver and spleen. IMPRESSION: Small amount of ascites is noted. Kidneys appear grossly normal without hydronephrosis or renal obstruction. Electronically Signed   By: Lupita Raider, M.D.   On: 03/06/2016 12:07   US Paracentesis  Result Date: 03/07/2016 INDICATION: Ascites and multifocal intra-abdominal abscess EXAM: ULTRASOUND GUIDED PARACENTESIS MEDICATIONS: None. ANESTHESIA/SEDATION: None . COMPLICATIONS: None immediate. PROCEDURE: Informed written consent was obtained from the patient after a discussion of the risks, benefits and alternatives to treatment. A timeout was performed prior to the initiation of the procedure. This patient has multiple intra abdominal fluid collections although the majority of these are not amenable to percutaneous drainage. Request was made for paracentesis of the ascites to rule out perforation. Initial ultrasound scanning demonstrates a large amount of ascites within the right lower abdominal quadrant. The right upper abdomen was prepped and draped in the usual sterile fashion. 1% lidocaine with epinephrine was used for local anesthesia. Following this, an 18 gauge spinal needle was introduced under real-time ultrasound guidance. Confirmatory image was obtained. The paracentesis was performed. The needle was removed and a dressing was applied. The patient tolerated the procedure well without immediate post procedural complication. FINDINGS: A total of approximately 60 mL of cloudy yellow fluid was removed. Samples were sent to the laboratory as requested by the clinical team. IMPRESSION: Successful ultrasound-guided paracentesis yielding 60 mL of peritoneal fluid. Electronically Signed   By: Alcide Clever M.D.   On: 03/07/2016 13:32     Assessment   35 y.o. admitted meeting SIRS criteria, initially attributed to  pyelonephritis but found to have complex intra-abdominal abscess possible TOA, ileus  Plan   1) Abdominal abscess TOA - s/p prior BTL makes TOA less likely secondary to ascending infection but hematoganous spread still possible in setting of sepsis - continue meropenem day 3 - Gonorrhea and Chlamydia negative doxycyline discontinued - s/p ultrasound guided paracentesis, culture pending - multi drug resistant E. Coli on urine culture -  blood cultures negative to date - I would strongly consider placement of NG for treatment of ileus, decompression of bowl may also may the fluid collections more amenable for IR drain placement  Vena Austria, MD [facility] Doctor Annamarie Major will be covering service this weekend if any questions 8/26 - 8/27

## 2016-03-08 NOTE — Progress Notes (Signed)
Pharmacy Antibiotic Note  Pamela [name] is a 35 y.o. female admitted on 03/05/2016 with sepsis/UTI.  Pharmacy has been consulted for meropnem dosing.  Plan: Meropenem 1 g iv q 8 hours.    Height: 5\' 2"  (157.5 cm) Weight: 118 lb 2.7 oz (53.6 kg) IBW/kg (Calculated) : 50.1  Temp (24hrs), Avg:97.8 F (36.6 C), Min:97.4 F (36.3 C), Max:98.1 F (36.7 C)   Recent Labs Lab 03/05/16 1710 03/05/16 1753 03/06/16 0436 03/07/16 0523 03/08/16 0938  WBC 22.3*  --  25.7* 25.8* 21.1*  CREATININE 0.89  --  0.70 0.59  --   LATICACIDVEN  --  1.6  --   --   --     Estimated Creatinine Clearance: 76.9 mL/min (by C-G formula based on SCr of 0.8 mg/dL).    No Known Allergies  Antimicrobials this admission: Doxycyline 8/24 >> 8/24 Ceftriaxone 8/22 >> 8/23 Meropenem 8/23 >>   Microbiology results: 8/22 BCx: NGTD x 2 8/22 UCx: ESBL E coli 8/22 MRSA PCR: negative 8/24: peritoneal fluid: NGTD 8/24 GC: negative  Thank you for allowing pharmacy to be a part of this patient's care.  Pamela [name], Lahari Suttles D 03/08/2016 2:13 PM

## 2016-03-08 NOTE — Progress Notes (Signed)
[facility] [place] at [facility]   PATIENT NAME: Pamela [name]    MR#:  [id number]  DATE OF BIRTH:  1980/09/22  SUBJECTIVE:  CHIEF COMPLAINT:   Chief Complaint  Patient presents with  . Abdominal Pain  Patient is a 35 year old Spanish female with not significant past medical history who presents to the hospital with complaints of abdominal pain and fever. Pain is described as 8 out of 10 by intensity and suprapubic area, associated with dysuria, nausea, vomiting, fevers and chills, she also admits of flank pain bilaterally, more on the left side. Her urinalysis revealed pyuria, concerning for acute pyelonephritis and patient was admitted for IV antibiotics. Ultrasound of the kidneys was unremarkable. But mild ascites. CT abdomen showed thicken and possible inflammation on right side colon, small intestine and some fluid collection, surgery want conservative management for now- US guided Ascites tap done ( 03/07/16)  pt continue to have pain and nausea.  Review of Systems  Constitutional: Negative for chills, fever and weight loss.  HENT: Negative for congestion.   Eyes: Negative for blurred vision and double vision.  Respiratory: Negative for cough, sputum production, shortness of breath and wheezing.   Cardiovascular: Negative for chest pain, palpitations, orthopnea, leg swelling and PND.  Gastrointestinal: Positive for abdominal pain, nausea and vomiting. Negative for blood in stool, constipation and diarrhea.  Genitourinary: Positive for flank pain. Negative for dysuria, frequency, hematuria and urgency.  Musculoskeletal: Negative for falls.  Neurological: Negative for dizziness, tremors, focal weakness and headaches.  Endo/Heme/Allergies: Does not bruise/bleed easily.  Psychiatric/Behavioral: Negative for depression. The patient does not have insomnia.     VITAL SIGNS: Blood pressure 105/83, pulse 74, temperature 98.8 F (37.1 C), temperature  source Axillary, resp. rate 15, height 5\' 2"  (1.575 m), weight 53.6 kg (118 lb 2.7 oz), last menstrual period 02/05/2016, SpO2 95 %.  PHYSICAL EXAMINATION:   GENERAL:  35 y.o.-year-old patient lying in the bed in moderate distress due to abdominal pain and flank pain, nausea.  EYES: Pupils equal, round, reactive to light and accommodation. No scleral icterus. Extraocular muscles intact.  HEENT: Head atraumatic, normocephalic. Oropharynx and nasopharynx clear.  NECK:  Supple, no jugular venous distention. No thyroid enlargement, no tenderness.  LUNGS: Normal breath sounds bilaterally, no wheezing, rales,rhonchi or crepitation. No use of accessory muscles of respiration.  CARDIOVASCULAR: S1, S2 normal. No murmurs, rubs, or gallops.  ABDOMEN: Soft, tender diffusely but no rebound , but voluntary guarding was noted, nondistended. Bowel sounds present. No organomegaly or mass. Bilateral CVA tenderness on percussion EXTREMITIES: No pedal edema, cyanosis, or clubbing.  NEUROLOGIC: Cranial nerves II through XII are intact. Muscle strength 5/5 in all extremities. Sensation intact. Gait not checked.  PSYCHIATRIC: The patient is alert and oriented x 3.  SKIN: No obvious rash, lesion, or ulcer.   ORDERS/RESULTS REVIEWED:   CBC  Recent Labs Lab 03/05/16 1710 03/06/16 0436 03/07/16 0523 03/08/16 0938  WBC 22.3* 25.7* 25.8* 21.1*  HGB 11.5* 10.5* 11.2* 11.3*  HCT 33.1* 30.3* 32.9* 32.2*  PLT 362 318 397 410  MCV 85.2 85.7 84.7 84.5  MCH 29.6 29.6 28.9 29.7  MCHC 34.7 34.6 34.1 35.1  RDW 13.6 13.8 14.2 14.2   ------------------------------------------------------------------------------------------------------------------  Chemistries   Recent Labs Lab 03/05/16 1710 03/06/16 0436 03/07/16 0523  NA 135 136 135  K 4.0 3.8 4.1  CL 100* 109 104  CO2 25 21* 25  GLUCOSE 146* 127* 118*  BUN 8 8 10  CREATININE 0.89 0.70 0.59  CALCIUM 8.5* 7.1* 7.3*  AST 26  --   --   ALT 47  --   --    ALKPHOS 106  --   --   BILITOT 0.3  --   --    ------------------------------------------------------------------------------------------------------------------ estimated creatinine clearance is 76.9 mL/min (by C-G formula based on SCr of 0.8 mg/dL). ------------------------------------------------------------------------------------------------------------------ No results for input(s): TSH, T4TOTAL, T3FREE, THYROIDAB in the last 72 hours.  Invalid input(s): FREET3  Cardiac Enzymes No results for input(s): CKMB, TROPONINI, MYOGLOBIN in the last 168 hours.  Invalid input(s): CK ------------------------------------------------------------------------------------------------------------------ Invalid input(s): POCBNP ---------------------------------------------------------------------------------------------------------------  RADIOLOGY: [place] Paracentesis  Result Date: 03/07/2016 INDICATION: Ascites and multifocal intra-abdominal abscess EXAM: ULTRASOUND GUIDED PARACENTESIS MEDICATIONS: None. ANESTHESIA/SEDATION: None . COMPLICATIONS: None immediate. PROCEDURE: Informed written consent was obtained from the patient after a discussion of the risks, benefits and alternatives to treatment. A timeout was performed prior to the initiation of the procedure. This patient has multiple intra abdominal fluid collections although the majority of these are not amenable to percutaneous drainage. Request was made for paracentesis of the ascites to rule out perforation. Initial ultrasound scanning demonstrates a large amount of ascites within the right lower abdominal quadrant. The right upper abdomen was prepped and draped in the usual sterile fashion. 1% lidocaine with epinephrine was used for local anesthesia. Following this, an 18 gauge spinal needle was introduced under real-time ultrasound guidance. Confirmatory image was obtained. The paracentesis was performed. The needle was removed and a dressing was  applied. The patient tolerated the procedure well without immediate post procedural complication. FINDINGS: A total of approximately 60 mL of cloudy yellow fluid was removed. Samples were sent to the laboratory as requested by the clinical team. IMPRESSION: Successful ultrasound-guided paracentesis yielding 60 mL of peritoneal fluid. Electronically Signed   By: Alcide CleverMark  Lukens M.D.   On: 03/07/2016 13:32    EKG: No orders found for this or any previous visit.  ASSESSMENT AND PLAN:  Active Problems:   Sepsis (HCC)   Diffuse abdominal pain #1. Sepsis due to urinary tract infection and pelvic abscess ,   on meropenem, resistant E coli in urinary cultures, tuboovarian abscess or thickening of right side bowel walls on CT of abdomen and pelvis,   Blood cultures are negative so far, MRSA by PCR is negative, urine culture e coli.    Appreciated GYn and Surgery help.   On IV meropenem. #2 Acute pyelonephritis, continue meropenem, watching white blood cell count cell count,  #3. Ascites of unclear etiology, questionable pelvic inflammatory disease, s/p US guided tap- wait for cx result- negative for 24 hrs.. #4. Leukocytosis, follow with antibiotic therapy #5. Metabolic acidosis, initiate patient on intravenous bicarbonate #6. Anemia, get Hemoccult #7. Hyperglycemia,  hemoglobin A1c 5.2   Management plans discussed with the patient, family and they are in agreement.   DRUG ALLERGIES: No Known Allergies  CODE STATUS:     Code Status Orders        Start     Ordered   03/05/16 1838  Full code  Continuous     03/05/16 1838    Code Status History    Date Active Date Inactive Code Status Order ID Comments User Context   This patient has a current code status but no historical code status.      TOTAL TIME TAKING CARE OF THIS PATIENT: 35 minutes.    Altamese DillingVACHHANI, Jeorgia Helming M.D on 03/08/2016 at 6:14 PM  Between 7am to 6pm - Pager -  [phone]  After 6pm go to [email or web address] - password  [facility]  [facility]  Office  [phone]  CC: Primary care physician; No PCP Per Patient

## 2016-03-08 NOTE — Progress Notes (Signed)
[facility] [place] [facility]   PATIENT NAME: Pamela [name]    MR#:  [id number]  DATE OF BIRTH:  05/08/1981  SUBJECTIVE:  CHIEF COMPLAINT:   Chief Complaint  Patient presents with  . Abdominal Pain  Patient is a 35 year old Spanish female with not significant past medical history who presents to the hospital with complaints of abdominal pain and fever. Pain is described as 8 out of 10 by intensity and suprapubic area, associated with dysuria, nausea, vomiting, fevers and chills, she also admits of flank pain bilaterally, more on the left side. Her urinalysis revealed pyuria, concerning for acute pyelonephritis and patient was admitted for IV antibiotics. Ultrasound of the kidneys was unremarkable. But mild ascites. CT abdomen showed thicken and possible inflammation on right side colon, small intestine and some fluid collection, surgery want conservative management for now- US guided Ascites tap done ( 03/07/16)  Review of Systems  Constitutional: Negative for chills, fever and weight loss.  HENT: Negative for congestion.   Eyes: Negative for blurred vision and double vision.  Respiratory: Negative for cough, sputum production, shortness of breath and wheezing.   Cardiovascular: Negative for chest pain, palpitations, orthopnea, leg swelling and PND.  Gastrointestinal: Positive for abdominal pain, nausea and vomiting. Negative for blood in stool, constipation and diarrhea.  Genitourinary: Positive for flank pain. Negative for dysuria, frequency, hematuria and urgency.  Musculoskeletal: Negative for falls.  Neurological: Negative for dizziness, tremors, focal weakness and headaches.  Endo/Heme/Allergies: Does not bruise/bleed easily.  Psychiatric/Behavioral: Negative for depression. The patient does not have insomnia.     VITAL SIGNS: Blood pressure 107/74, pulse 72, temperature 97.4 F (36.3 C), temperature source Oral, resp. rate 20, height 5\' 2"   (1.575 m), weight 53.6 kg (118 lb 2.7 oz), last menstrual period 02/05/2016, SpO2 94 %.  PHYSICAL EXAMINATION:   GENERAL:  35 y.o.-year-old patient lying in the bed in moderate distress due to abdominal pain and flank pain, nausea.  EYES: Pupils equal, round, reactive to light and accommodation. No scleral icterus. Extraocular muscles intact.  HEENT: Head atraumatic, normocephalic. Oropharynx and nasopharynx clear.  NECK:  Supple, no jugular venous distention. No thyroid enlargement, no tenderness.  LUNGS: Normal breath sounds bilaterally, no wheezing, rales,rhonchi or crepitation. No use of accessory muscles of respiration.  CARDIOVASCULAR: S1, S2 normal. No murmurs, rubs, or gallops.  ABDOMEN: Soft, tender diffusely but no rebound , but voluntary guarding was noted, nondistended. Bowel sounds present. No organomegaly or mass. Bilateral CVA tenderness on percussion EXTREMITIES: No pedal edema, cyanosis, or clubbing.  NEUROLOGIC: Cranial nerves II through XII are intact. Muscle strength 5/5 in all extremities. Sensation intact. Gait not checked.  PSYCHIATRIC: The patient is alert and oriented x 3.  SKIN: No obvious rash, lesion, or ulcer.   ORDERS/RESULTS REVIEWED:   CBC  Recent Labs Lab 03/05/16 1710 03/06/16 0436 03/07/16 0523  WBC 22.3* 25.7* 25.8*  HGB 11.5* 10.5* 11.2*  HCT 33.1* 30.3* 32.9*  PLT 362 318 397  MCV 85.2 85.7 84.7  MCH 29.6 29.6 28.9  MCHC 34.7 34.6 34.1  RDW 13.6 13.8 14.2   ------------------------------------------------------------------------------------------------------------------  Chemistries   Recent Labs Lab 03/05/16 1710 03/06/16 0436 03/07/16 0523  NA 135 136 135  K 4.0 3.8 4.1  CL 100* 109 104  CO2 25 21* 25  GLUCOSE 146* 127* 118*  BUN 8 8 10   CREATININE 0.89 0.70 0.59  CALCIUM 8.5* 7.1* 7.3*  AST 26  --   --  ALT 47  --   --   ALKPHOS 106  --   --   BILITOT 0.3  --   --     ------------------------------------------------------------------------------------------------------------------ estimated creatinine clearance is 76.9 mL/min (by C-G formula based on SCr of 0.8 mg/dL). ------------------------------------------------------------------------------------------------------------------ No results for input(s): TSH, T4TOTAL, T3FREE, THYROIDAB in the last 72 hours.  Invalid input(s): FREET3  Cardiac Enzymes No results for input(s): CKMB, TROPONINI, MYOGLOBIN in the last 168 hours.  Invalid input(s): CK ------------------------------------------------------------------------------------------------------------------ Invalid input(s): POCBNP ---------------------------------------------------------------------------------------------------------------  RADIOLOGY: Ct Abdomen Pelvis W Contrast  Result Date: 03/06/2016 CLINICAL DATA:  Dysuria, abdominal pain, fever and suprapubic pain for approximately 10 days. EXAM: CT ABDOMEN AND PELVIS WITH CONTRAST TECHNIQUE: Multidetector CT imaging of the abdomen and pelvis was performed using the standard protocol following bolus administration of intravenous contrast. CONTRAST:  100 ml ISOVUE-300 IOPAMIDOL (ISOVUE-300) INJECTION 61% COMPARISON:  None. FINDINGS: There small bilateral pleural effusions. Associated compressive atelectasis in the lung bases is identified. There is a moderate volume of free abdominal fluid. Multiple fluid collections are identified in the pelvis. A collection just superior to the right adnexa in the pelvis measures approximately 6.0 cm AP by 3.1 cm transverse by 2.6 cm craniocaudal. A collection anterior to the rectum which extends cephalad into the left pelvis measures approximately 4.5 cm transverse by 2.1 cm AP by 6.3 cm craniocaudal. A septated collection centered in the right adnexa measures 5.2 cm AP x 4.5 cm transverse by approximately 3.4 cm craniocaudal. A third collection in the right lower  quadrant measures 3.6 cm transverse by 2.4 cm AP by 2.5 cm craniocaudal. These collections may communicate. There is marked inflammatory change in mesenteric and omental fat in the pelvis. Multiple small bowel loops demonstrate wall thickening and hyperenhancement. Additionally, the walls of the ascending and proximal transverse colon are hyperenhancing and thickened as on the walls of the sigmoid colon. The appendix is hyperenhancing on image 67. The uterus appears normal. No free intraperitoneal air, pneumatosis or portal venous gas is seen. The kidneys demonstrate homogeneous and normal enhancement. No hydronephrosis is present. The urinary bladder is unremarkable. The gallbladder, liver, spleen pancreas, biliary tree and adrenal glands appear normal. No bony abnormality is identified. IMPRESSION: Constellation of findings most suggestive of pelvic inflammatory disease with tubo-ovarian abscess on the right and extensive abscess formation the abdomen and pelvis. Abnormal appearance of pelvic small bowel loops in the ascending and proximal transverse colon likely reflects secondary inflammation. Missed appendicitis is possible but thought less likely. Normal appearing kidneys.  No CT evidence of pyelonephritis. Small bilateral pleural effusions and compressive basilar atelectasis. Electronically Signed   By: Drusilla Kanner M.D.   On: 03/06/2016 18:25   US Renal  Result Date: 03/06/2016 CLINICAL DATA:  Acute pyelonephritis. EXAM: RENAL / URINARY TRACT ULTRASOUND COMPLETE COMPARISON:  None. FINDINGS: Right Kidney: Length: 10.3 cm. Echogenicity within normal limits. No mass or hydronephrosis visualized. Left Kidney: Length: 9.2 cm. Echogenicity within normal limits. No mass or hydronephrosis visualized. Bladder: Appears normal for degree of bladder distention. Bilateral ureteral jets are noted. Small amount of ascites is noted around the liver and spleen. IMPRESSION: Small amount of ascites is noted. Kidneys  appear grossly normal without hydronephrosis or renal obstruction. Electronically Signed   By: Lupita Raider, M.D.   On: 03/06/2016 12:07   US Paracentesis  Result Date: 03/07/2016 INDICATION: Ascites and multifocal intra-abdominal abscess EXAM: ULTRASOUND GUIDED PARACENTESIS MEDICATIONS: None. ANESTHESIA/SEDATION: None . COMPLICATIONS: None immediate. PROCEDURE: Informed written consent was obtained  from the patient after a discussion of the risks, benefits and alternatives to treatment. A timeout was performed prior to the initiation of the procedure. This patient has multiple intra abdominal fluid collections although the majority of these are not amenable to percutaneous drainage. Request was made for paracentesis of the ascites to rule out perforation. Initial ultrasound scanning demonstrates a large amount of ascites within the right lower abdominal quadrant. The right upper abdomen was prepped and draped in the usual sterile fashion. 1% lidocaine with epinephrine was used for local anesthesia. Following this, an 18 gauge spinal needle was introduced under real-time ultrasound guidance. Confirmatory image was obtained. The paracentesis was performed. The needle was removed and a dressing was applied. The patient tolerated the procedure well without immediate post procedural complication. FINDINGS: A total of approximately 60 mL of cloudy yellow fluid was removed. Samples were sent to the laboratory as requested by the clinical team. IMPRESSION: Successful ultrasound-guided paracentesis yielding 60 mL of peritoneal fluid. Electronically Signed   By: Alcide CleverMark  Lukens M.D.   On: 03/07/2016 13:32    EKG: No orders found for this or any previous visit.  ASSESSMENT AND PLAN:  Active Problems:   Sepsis (HCC)   Diffuse abdominal pain #1. Sepsis due to urinary tract infection, rule out pelvic inflammatory disease, initiate patient on meropenem, awaiting for urinary cultures, tuboovarian abscess or  thickening of right side bowel walls on CT of abdomen and pelvis,   Blood cultures are negative so far, MRSA by PCR is negative, urine culture e coli.    Appreciated GYn and Surgery help. #2 Acute pyelonephritis, continue meropenem, watching white blood cell count cell count, awaiting for urinary culture  #3. Ascites of unclear etiology, questionable pelvic inflammatory disease, s/p US guided tap- wait for cx result. #4. Leukocytosis, follow with antibiotic therapy #5. Metabolic acidosis, initiate patient on intravenous bicarbonate #6. Anemia, get Hemoccult #7. Hyperglycemia,  hemoglobin A1c 5.2   Management plans discussed with the patient, family and they are in agreement.   DRUG ALLERGIES: No Known Allergies  CODE STATUS:     Code Status Orders        Start     Ordered   03/05/16 1838  Full code  Continuous     03/05/16 1838    Code Status History    Date Active Date Inactive Code Status Order ID Comments User Context   This patient has a current code status but no historical code status.      TOTAL TIME TAKING CARE OF THIS PATIENT: 35 minutes.    Altamese DillingVACHHANI, Blu Lori M.D on 03/08/2016 at 9:22 AM  Between 7am to 6pm - Pager - [phone]  After 6pm go to [email or web address] - password [email or web address] [facility]  [facility]  Office  [phone]  CC: Primary care physician; No PCP Per Patient

## 2016-03-08 NOTE — Care Management (Signed)
Medication Management clinic is open today until 4PM to assist patient with medications. RNCM may need to provide MATCH assistance for discharge over the weekend. Patient will need prescriptions to take to a participating pharmacy so no electronic scripts will work. RNCM to continue to follow for medication assistance.

## 2016-03-08 NOTE — Progress Notes (Signed)
Spoke with vachhani and pabo n re patient normally takes 50 mcgs synthroid daily.  No definitive answer.  Will f/u with vachhani, pt is to NPO though

## 2016-03-09 LAB — BASIC METABOLIC PANEL
ANION GAP: 6 (ref 5–15)
BUN: 9 mg/dL (ref 6–20)
CHLORIDE: 102 mmol/L (ref 101–111)
CO2: 28 mmol/L (ref 22–32)
Calcium: 7.2 mg/dL — ABNORMAL LOW (ref 8.9–10.3)
Creatinine, Ser: 0.51 mg/dL (ref 0.44–1.00)
GFR calc Af Amer: >60 mL/min (ref >60)
GFR calc non Af Amer: >60 mL/min (ref >60)
GLUCOSE: 77 mg/dL (ref 65–99)
POTASSIUM: 4 mmol/L (ref 3.5–5.1)
SODIUM: 136 mmol/L (ref 135–145)

## 2016-03-09 LAB — CBC
HCT: 33.8 % — ABNORMAL LOW (ref 35.0–47.0)
HEMOGLOBIN: 11.8 g/dL — AB (ref 12.0–16.0)
MCH: 29.4 pg (ref 26.0–34.0)
MCHC: 34.7 g/dL (ref 32.0–36.0)
MCV: 84.6 fL (ref 80.0–100.0)
Platelets: 427 10*3/uL (ref 150–440)
RBC: 4 MIL/uL (ref 3.80–5.20)
RDW: 13.9 % (ref 11.5–14.5)
WBC: 23.2 10*3/uL — ABNORMAL HIGH (ref 3.6–11.0)

## 2016-03-09 MED ORDER — PHENAZOPYRIDINE HCL 100 MG PO TABS
100.0000 mg | ORAL_TABLET | Freq: Three times a day (TID) | ORAL | Status: DC
  Administered 2016-03-09 – 2016-03-13 (×11): 100 mg via ORAL
  Filled 2016-03-09 (×11): qty 1

## 2016-03-09 MED ORDER — ALBUMIN HUMAN 25 % IV SOLN
12.5000 g | Freq: Once | INTRAVENOUS | Status: AC
  Administered 2016-03-09: 12.5 g via INTRAVENOUS
  Filled 2016-03-09: qty 50

## 2016-03-09 MED ORDER — LACTATED RINGERS IV BOLUS (SEPSIS)
1000.0000 mL | Freq: Once | INTRAVENOUS | Status: AC
  Administered 2016-03-09: 1000 mL via INTRAVENOUS

## 2016-03-09 MED ORDER — ENOXAPARIN SODIUM 40 MG/0.4ML ~~LOC~~ SOLN
40.0000 mg | SUBCUTANEOUS | Status: DC
  Administered 2016-03-09: 40 mg via SUBCUTANEOUS
  Filled 2016-03-09 (×2): qty 0.4

## 2016-03-09 NOTE — Progress Notes (Signed)
Pharmacy Antibiotic Note  Pamela [name] is a 35 y.o. female admitted on 03/05/2016 with sepsis/UT/pelvic abscess .  Pharmacy has been consulted for meropnem dosing.  Plan: Meropenem 1 g iv q 8 hours.    Height: 5\' 2"  (157.5 cm) Weight: 118 lb 2.7 oz (53.6 kg) IBW/kg (Calculated) : 50.1  Temp (24hrs), Avg:98.4 F (36.9 C), Min:97.9 F (36.6 C), Max:98.8 F (37.1 C)   Recent Labs Lab 03/05/16 1710 03/05/16 1753 03/06/16 0436 03/07/16 0523 03/08/16 0938 03/09/16 0611  WBC 22.3*  --  25.7* 25.8* 21.1* 23.2*  CREATININE 0.89  --  0.70 0.59  --  0.51  LATICACIDVEN  --  1.6  --   --   --   --     Estimated Creatinine Clearance: 76.9 mL/min (by C-G formula based on SCr of 0.8 mg/dL).    No Known Allergies  Antimicrobials this admission: Doxycyline 8/24 >> 8/24 Ceftriaxone 8/22 >> 8/23 Meropenem 8/23 >>   Microbiology results: 8/22 BCx: NGTD x 2 8/22 UCx: ESBL E coli 8/22 MRSA PCR: negative 8/24: peritoneal fluid: NGTD 8/24 GC: negative  Thank you for allowing pharmacy to be a part of this patient's care.  Sonna Lipsky A 03/09/2016 11:47 AM

## 2016-03-09 NOTE — Progress Notes (Signed)
[facility] - [place] [facility]   PATIENT NAME: Pamela [name]    MR#:  [id number]  DATE OF BIRTH:  [birth year]  SUBJECTIVE:  CHIEF COMPLAINT:   Chief Complaint  Patient presents with  . Abdominal Pain  Patient is a 35 year old Spanish female with not significant past medical history who presents to the hospital with complaints of abdominal pain and fever. Pain is described as 8 out of 10 by intensity and suprapubic area, associated with dysuria, nausea, vomiting, fevers and chills, she also admits of flank pain bilaterally, more on the left side. Her urinalysis revealed pyuria, concerning for acute pyelonephritis and patient was admitted for IV antibiotics. Ultrasound of the kidneys was unremarkable. But mild ascites. CT abdomen showed thicken and possible inflammation on right side colon, small intestine and some fluid collection, surgery want conservative management for now- US guided Ascites tap done ( 03/07/16)   The patient complained of abdominal pain and nausea this morning.  Review of Systems  Constitutional: Negative for chills, fever and weight loss.  HENT: Negative for congestion.   Eyes: Negative for blurred vision and double vision.  Respiratory: Negative for cough, sputum production, shortness of breath and wheezing.   Cardiovascular: Negative for chest pain, palpitations, orthopnea, leg swelling and PND.  Gastrointestinal: Positive for abdominal pain and nausea. Negative for blood in stool, constipation, diarrhea and vomiting.  Genitourinary: Positive for flank pain. Negative for dysuria, frequency, hematuria and urgency.  Musculoskeletal: Negative for falls.  Neurological: Negative for dizziness, tremors, focal weakness and headaches.  Endo/Heme/Allergies: Does not bruise/bleed easily.  Psychiatric/Behavioral: Negative for depression. The patient does not have insomnia.     VITAL SIGNS: Blood pressure 128/89, pulse 87, temperature 98 F  (36.7 C), temperature source Oral, resp. rate 19, height 5\' 2"  (1.575 m), weight 118 lb 2.7 oz (53.6 kg), last menstrual period 02/05/2016, SpO2 94 %.  PHYSICAL EXAMINATION:   GENERAL:  35 y.o.-year-old patient lying in the bed in moderate distress due to abdominal pain and flank pain, nausea.  EYES: Pupils equal, round, reactive to light and accommodation. No scleral icterus. Extraocular muscles intact.  HEENT: Head atraumatic, normocephalic. Oropharynx and nasopharynx clear.  NECK:  Supple, no jugular venous distention. No thyroid enlargement, no tenderness.  LUNGS: Normal breath sounds bilaterally, no wheezing, rales,rhonchi or crepitation. No use of accessory muscles of respiration.  CARDIOVASCULAR: S1, S2 normal. No murmurs, rubs, or gallops.  ABDOMEN: Soft, tender diffusely but no rebound , but voluntary guarding was noted, nondistended. Bowel sounds present. No organomegaly or mass. Bilateral CVA tenderness on percussion EXTREMITIES: No pedal edema, cyanosis, or clubbing.  NEUROLOGIC: Cranial nerves II through XII are intact. Muscle strength 5/5 in all extremities. Sensation intact. Gait not checked.  PSYCHIATRIC: The patient is alert and oriented x 3.  SKIN: No obvious rash, lesion, or ulcer.   ORDERS/RESULTS REVIEWED:   CBC  Recent Labs Lab 03/05/16 1710 03/06/16 0436 03/07/16 0523 03/08/16 0938 03/09/16 0611  WBC 22.3* 25.7* 25.8* 21.1* 23.2*  HGB 11.5* 10.5* 11.2* 11.3* 11.8*  HCT 33.1* 30.3* 32.9* 32.2* 33.8*  PLT 362 318 397 410 427  MCV 85.2 85.7 84.7 84.5 84.6  MCH 29.6 29.6 28.9 29.7 29.4  MCHC 34.7 34.6 34.1 35.1 34.7  RDW 13.6 13.8 14.2 14.2 13.9   ------------------------------------------------------------------------------------------------------------------  Chemistries   Recent Labs Lab 03/05/16 1710 03/06/16 0436 03/07/16 0523 03/09/16 0611  NA 135 136 135 136  K 4.0 3.8 4.1 4.0  CL 100*  109 104 102  CO2 25 21* 25 28  GLUCOSE 146* 127* 118*  77  BUN 8 8 10 9   CREATININE 0.89 0.70 0.59 0.51  CALCIUM 8.5* 7.1* 7.3* 7.2*  AST 26  --   --   --   ALT 47  --   --   --   ALKPHOS 106  --   --   --   BILITOT 0.3  --   --   --    ------------------------------------------------------------------------------------------------------------------ estimated creatinine clearance is 76.9 mL/min (by C-G formula based on SCr of 0.8 mg/dL). ------------------------------------------------------------------------------------------------------------------ No results for input(s): TSH, T4TOTAL, T3FREE, THYROIDAB in the last 72 hours.  Invalid input(s): FREET3  Cardiac Enzymes No results for input(s): CKMB, TROPONINI, MYOGLOBIN in the last 168 hours.  Invalid input(s): CK ------------------------------------------------------------------------------------------------------------------ Invalid input(s): POCBNP ---------------------------------------------------------------------------------------------------------------  RADIOLOGY: No results found.  EKG: No orders found for this or any previous visit.  ASSESSMENT AND PLAN:  Active Problems:   Sepsis (HCC)   Diffuse abdominal pain #1. Sepsis due to urinary tract infection and pelvic abscess ,   on meropenem, resistant E coli in urinary cultures, tuboovarian abscess or thickening of right side bowel walls on CT of abdomen and pelvis,   Blood cultures are negative so far, MRSA by PCR is negative, urine culture e coli.    Appreciated GYn and Surgery help.   continue IV meropenem. ID consult.  #2 Acute pyelonephritis (ESBL), continue meropenem, watching white blood cell count cell count,   #3. Ascites of unclear etiology, questionable pelvic inflammatory disease, s/p US guided tap- wait for cx result- negative for 24 hrs.  #4. Leukocytosis, WBC is still high,  Follow up CBC.  #5. Metabolic acidosis, improved with intravenous bicarbonate.  #6. Anemia, stable.  #7. Hyperglycemia,  improved.  hemoglobin A1c 5.2  Ileus, per Dr. Everlene FarrierPabon, continue NPO Will likely need a repeat scan tomorrow to re- evaluate the collections   Management plans discussed with the patient, family and they are in agreement.   DRUG ALLERGIES: No Known Allergies  CODE STATUS:     Code Status Orders        Start     Ordered   03/05/16 1838  Full code  Continuous     03/05/16 1838    Code Status History    Date Active Date Inactive Code Status Order ID Comments User Context   This patient has a current code status but no historical code status.      TOTAL TIME TAKING CARE OF THIS PATIENT: 38 minutes.    Shaune Pollackhen, Bella Brummet M.D on 03/09/2016 at 1:56 PM  Between 7am to 6pm - Pager - [phone]  After 6pm go to [email or web address] - password [email or web address] [facility]  [facility]  Office  [phone]  CC: Primary care physician; No PCP Per Patient

## 2016-03-09 NOTE — Progress Notes (Signed)
Report given to Vern, Charity fundraiserN. Pt in stable condition. All belongings with pt.

## 2016-03-09 NOTE — Progress Notes (Signed)
Pelvic abscess with ascites No growth from paracentesis Is a little bit better. No flatus yet. White count is still elevated  PE awake and alert, not toxic Abd: soft, mild TTP, no periotnitis or rebound Distended  A/p ileus continue NPO Will likely need a repeat scan tomorrow to re- evaluate the collections  No surgical intervention  Continue A/bs rx

## 2016-03-10 ENCOUNTER — Encounter: Payer: Self-pay | Admitting: Radiology

## 2016-03-10 ENCOUNTER — Inpatient Hospital Stay: Payer: Medicaid Other

## 2016-03-10 DIAGNOSIS — N739 Female pelvic inflammatory disease, unspecified: Secondary | ICD-10-CM

## 2016-03-10 DIAGNOSIS — N732 Unspecified parametritis and pelvic cellulitis: Secondary | ICD-10-CM

## 2016-03-10 LAB — CBC
HCT: 35.6 % (ref 35.0–47.0)
Hemoglobin: 12.3 g/dL (ref 12.0–16.0)
MCH: 29.1 pg (ref 26.0–34.0)
MCHC: 34.4 g/dL (ref 32.0–36.0)
MCV: 84.5 fL (ref 80.0–100.0)
PLATELETS: 468 10*3/uL — AB (ref 150–440)
RBC: 4.22 MIL/uL (ref 3.80–5.20)
RDW: 14.1 % (ref 11.5–14.5)
WBC: 25.2 10*3/uL — AB (ref 3.6–11.0)

## 2016-03-10 LAB — CULTURE, BLOOD (ROUTINE X 2)
CULTURE: NO GROWTH
Culture: NO GROWTH

## 2016-03-10 MED ORDER — ALBUMIN HUMAN 25 % IV SOLN
12.5000 g | Freq: Once | INTRAVENOUS | Status: AC
  Administered 2016-03-10: 12.5 g via INTRAVENOUS
  Filled 2016-03-10: qty 50

## 2016-03-10 MED ORDER — DIATRIZOATE MEGLUMINE & SODIUM 66-10 % PO SOLN
15.0000 mL | ORAL | Status: AC
  Administered 2016-03-10 (×2): 15 mL via ORAL

## 2016-03-10 MED ORDER — LEVOTHYROXINE SODIUM 25 MCG PO TABS
50.0000 ug | ORAL_TABLET | Freq: Every day | ORAL | Status: DC
  Administered 2016-03-12 – 2016-03-14 (×3): 50 ug via ORAL
  Filled 2016-03-10 (×4): qty 2

## 2016-03-10 MED ORDER — IOPAMIDOL (ISOVUE-300) INJECTION 61%
100.0000 mL | Freq: Once | INTRAVENOUS | Status: AC | PRN
  Administered 2016-03-10: 100 mL via INTRAVENOUS

## 2016-03-10 MED ORDER — LACTATED RINGERS IV BOLUS (SEPSIS)
500.0000 mL | Freq: Once | INTRAVENOUS | Status: AC
  Administered 2016-03-10: 500 mL via INTRAVENOUS

## 2016-03-10 NOTE — Progress Notes (Signed)
[facility] - [place] at [facility]   PATIENT NAME: Pamela [name]    MR#:  [id number]  DATE OF BIRTH:  09-22-1980  SUBJECTIVE:  CHIEF COMPLAINT:   Chief Complaint  Patient presents with  . Abdominal Pain  Patient is a 35 year old Spanish female with not significant past medical history who presents to the hospital with complaints of abdominal pain and fever. Pain is described as 8 out of 10 by intensity and suprapubic area, associated with dysuria, nausea, vomiting, fevers and chills, she also admits of flank pain bilaterally, more on the left side. Her urinalysis revealed pyuria, concerning for acute pyelonephritis and patient was admitted for IV antibiotics. Ultrasound of the kidneys was unremarkable. But mild ascites. CT abdomen showed thicken and possible inflammation on right side colon, small intestine and some fluid collection, surgery want conservative management for now- US guided Ascites tap done ( 03/07/16)   The patient complained of abdominal pain but no nausea or vomiting, now BM. She feels hungry, wants to eat.  Review of Systems  Constitutional: Negative for chills, fever and weight loss.  HENT: Negative for congestion.   Eyes: Negative for blurred vision and double vision.  Respiratory: Negative for cough, sputum production, shortness of breath and wheezing.   Cardiovascular: Negative for chest pain, palpitations, orthopnea, leg swelling and PND.  Gastrointestinal: Positive for abdominal pain. Negative for blood in stool, constipation, diarrhea, nausea and vomiting.  Genitourinary: Positive for flank pain. Negative for dysuria, frequency, hematuria and urgency.  Musculoskeletal: Negative for falls.  Neurological: Negative for dizziness, tremors, focal weakness and headaches.  Endo/Heme/Allergies: Does not bruise/bleed easily.  Psychiatric/Behavioral: Negative for depression. The patient does not have insomnia.     VITAL SIGNS: Blood  pressure 123/74, pulse 66, temperature 98.4 F (36.9 C), temperature source Oral, resp. rate 16, height 5\' 2"  (1.575 m), weight 118 lb 2.7 oz (53.6 kg), last menstrual period 02/05/2016, SpO2 93 %.  PHYSICAL EXAMINATION:   GENERAL:  35 y.o.-year-old patient lying in the bed in moderate distress due to abdominal pain and flank pain, nausea.  EYES: Pupils equal, round, reactive to light and accommodation. No scleral icterus. Extraocular muscles intact.  HEENT: Head atraumatic, normocephalic. Oropharynx and nasopharynx clear.  NECK:  Supple, no jugular venous distention. No thyroid enlargement, no tenderness.  LUNGS: Normal breath sounds bilaterally, no wheezing, rales,rhonchi or crepitation. No use of accessory muscles of respiration.  CARDIOVASCULAR: S1, S2 normal. No murmurs, rubs, or gallops.  ABDOMEN: Soft, tender diffusely but no rebound, mild distended, Bowel sounds present. No organomegaly or mass. Bilateral CVA tenderness on percussion. EXTREMITIES: No pedal edema, cyanosis, or clubbing.  NEUROLOGIC: Cranial nerves II through XII are intact. Muscle strength 5/5 in all extremities. Sensation intact. Gait not checked.  PSYCHIATRIC: The patient is alert and oriented x 3.  SKIN: No obvious rash, lesion, or ulcer.   ORDERS/RESULTS REVIEWED:   CBC  Recent Labs Lab 03/06/16 0436 03/07/16 0523 03/08/16 0938 03/09/16 0611 03/10/16 0608  WBC 25.7* 25.8* 21.1* 23.2* 25.2*  HGB 10.5* 11.2* 11.3* 11.8* 12.3  HCT 30.3* 32.9* 32.2* 33.8* 35.6  PLT 318 397 410 427 468*  MCV 85.7 84.7 84.5 84.6 84.5  MCH 29.6 28.9 29.7 29.4 29.1  MCHC 34.6 34.1 35.1 34.7 34.4  RDW 13.8 14.2 14.2 13.9 14.1   ------------------------------------------------------------------------------------------------------------------  Chemistries   Recent Labs Lab 03/05/16 1710 03/06/16 0436 03/07/16 0523 03/09/16 0611  NA 135 136 135 136  K 4.0 3.8 4.1 4.0  CL 100* 109 104 102  CO2 25 21* 25 28  GLUCOSE  146* 127* 118* 77  BUN 8 8 10 9   CREATININE 0.89 0.70 0.59 0.51  CALCIUM 8.5* 7.1* 7.3* 7.2*  AST 26  --   --   --   ALT 47  --   --   --   ALKPHOS 106  --   --   --   BILITOT 0.3  --   --   --    ------------------------------------------------------------------------------------------------------------------ estimated creatinine clearance is 76.9 mL/min (by C-G formula based on SCr of 0.8 mg/dL). ------------------------------------------------------------------------------------------------------------------ No results for input(s): TSH, T4TOTAL, T3FREE, THYROIDAB in the last 72 hours.  Invalid input(s): FREET3  Cardiac Enzymes No results for input(s): CKMB, TROPONINI, MYOGLOBIN in the last 168 hours.  Invalid input(s): CK ------------------------------------------------------------------------------------------------------------------ Invalid input(s): POCBNP ---------------------------------------------------------------------------------------------------------------  RADIOLOGY: No results found.  EKG: No orders found for this or any previous visit.  ASSESSMENT AND PLAN:  Active Problems:   Sepsis (HCC)   Diffuse abdominal pain #1. Sepsis due to urinary tract infection and pelvic abscess ,   on meropenem, resistant E coli in urinary cultures, tuboovarian abscess or thickening of right side bowel walls on CT of abdomen and pelvis,   Blood cultures are negative so far, MRSA by PCR is negative, urine culture e coli.    Appreciated GYn and Surgery help.   continue IV meropenem. F/u ID consult.  #2 Acute pyelonephritis (ESBL), continue meropenem, watching white blood cell count cell count,   #3. Ascites of unclear etiology, questionable pelvic inflammatory disease, s/p US guided tap- wait for cx result- negative for 2 days.  #4. Leukocytosis, WBC is still high,  Follow up CBC.  #5. Metabolic acidosis, improved with intravenous bicarbonate.  #6. Anemia, stable.  #7.  Hyperglycemia, improved.  hemoglobin A1c 5.2  Ileus, per Dr. Everlene FarrierPabon, continue NPO F/u abdomen scan today.  Management plans discussed with the patient, family and they are in agreement.   DRUG ALLERGIES: No Known Allergies  CODE STATUS:     Code Status Orders        Start     Ordered   03/05/16 1838  Full code  Continuous     03/05/16 1838    Code Status History    Date Active Date Inactive Code Status Order ID Comments User Context   This patient has a current code status but no historical code status.      TOTAL TIME TAKING CARE OF THIS PATIENT: 38 minutes.    Shaune Pollackhen, Aceyn Kathol M.D on 03/10/2016 at 11:42 AM  Between 7am to 6pm - Pager - [phone]  After 6pm go to [email or web address] - password [email or web address] [facility]  [facility]  Office  [phone]  CC: Primary care physician; No PCP Per Patient

## 2016-03-10 NOTE — Progress Notes (Signed)
Pt still having severe pain in the, lower abd which is quite distended. Pain med not completely relieving her pain.

## 2016-03-10 NOTE — Plan of Care (Signed)
Problem: Pain Managment: Goal: General experience of comfort will improve Outcome: Progressing Patient instructed through interpreter on pain medications. Patient given pain medication throughout shift and sleeping between care.

## 2016-03-10 NOTE — Progress Notes (Addendum)
CC: abdominal abscesses and Pelvic Abscesses Subjective: Wbc still elevated, continues intermittent pain HD adeqaute  Objective: Vital signs in last 24 hours: Temp:  [97.7 F (36.5 C)-98.4 F (36.9 C)] 97.8 F (36.6 C) (08/27 1313) Pulse Rate:  [65-86] 66 (08/27 1313) Resp:  [16-24] 16 (08/27 1313) BP: (112-150)/(74-83) 150/83 (08/27 1313) SpO2:  [92 %-99 %] 92 % (08/27 1313) Last BM Date: 03/05/16  Intake/Output from previous day: 08/26 0701 - 08/27 0700 In: 4929.3 [P.O.:30; I.V.:4819.3; IV Piggyback:80] Out: 2300 [Urine:2300] Intake/Output this shift: Total I/O In: 1602 [I.V.:464; IV Piggyback:1138] Out: 1400 [Urine:1400]  Physical exam:non toxic NAD Abd: distended, soft mild diffuse tenderness, no peritonitis Lab Results: CBC   Recent Labs  03/09/16 0611 03/10/16 0608  WBC 23.2* 25.2*  HGB 11.8* 12.3  HCT 33.8* 35.6  PLT 427 468*   BMET  Recent Labs  03/09/16 0611  NA 136  K 4.0  CL 102  CO2 28  GLUCOSE 77  BUN 9  CREATININE 0.51  CALCIUM 7.2*   PT/INR No results for input(s): LABPROT, INR in the last 72 hours. ABG No results for input(s): PHART, HCO3 in the last 72 hours.  Invalid input(s): PCO2, PO2  Studies/Results: Ct Abdomen Pelvis W Contrast  Result Date: 03/10/2016 CLINICAL DATA:  Pelvic abscess presumed related to pelvic inflammatory disease. Missed appendicitis also considered. EXAM: CT ABDOMEN AND PELVIS WITH CONTRAST TECHNIQUE: Multidetector CT imaging of the abdomen and pelvis was performed using the standard protocol following bolus administration of intravenous contrast. CONTRAST:  ISOVUE-300 IOPAMIDOL (ISOVUE-300) INJECTION 61% COMPARISON:  CT 03/06/2016 FINDINGS: Lower chest: Moderate bilateral pleural effusions increased from prior. Bibasilar associated passive atelectasis. No pericardial effusion. Hepatobiliary: Moderate volume of fluid surrounds the liver similar prior. No biliary duct dilatation. Gallbladder normal.  Pancreas: Pancreas is normal. No ductal dilatation. No pancreatic inflammation. Spleen: Normal spleen Adrenals/urinary tract: Adrenal glands are normal. The kidneys enhance symmetrically. No evidence of pyelonephritis. No hydronephrosis. Bowel lateral mild hydroureter present. Bladder normal. Stomach/Bowel: Oral contrast in stomach. Duodenum and proximal small bowel are normal. There is mild dilatation of the proximal small bowel to 3.4 cm. Poor transit of oral contrast to the more distal small bowel. There is contrast within colon presumably from CT scan 4 days prior. No evidence of perforation. No high-grade obstruction identified. Vascular/Lymphatic: Abdominal aorta is normal caliber. There is no retroperitoneal or periportal lymphadenopathy. No pelvic lymphadenopathy. Reproductive: Uterus is normal. There 2 complex fluid collections adjacent to the uterus with a tubular extensions the towards the uterine body. These findings are most consistent with tubo-ovarian abscess with hydro or pyosalpinx. The RIGHT collection measures approximately 7.0 by 5.3 cm compared to 7.6 by 4.8 cm remeasured. The LEFT collection measures 4.6 by 3.2 cm compared to 4.7 by 2.8 cm. Visually these collections are very similar prior. More superiorly there is an intraperitoneal abscess in the RIGHT abdomen along the iliac vessels measuring 5.5 by of 3.8 cm compared to 5.2 by 4.0 cm. No significant change in size. There is enhancing rim of this abscess. Even more superiorly within the leaves of the small bowel mesentery of the RIGHT lower quadrant intraperitoneal abscess measures 6.0 by 5.9 cm increased from 5.0 by 5.0 cm. There is a tubular structure which extends from the region of the the cecum measuring a 10 mm diameter (image 66, series 2). This could represent the appendix. The thickening may be secondary inflammation due to the adjacent the pelvic infection. Other: New the round fluid collection within  a RIGHT inguinal hernia  measuring 2.5 cm with peripheral enhancement. Musculoskeletal: No aggressive osseous lesion. IMPRESSION: 1. Bilateral adnexal fluid collections with a tubular extension to the uterus suggest bilateral tubo-ovarian abscess with hydro or pyosalpinx. 2. Two intra-abdominal abscesses are not decreased in size compared to prior. The more inferior abscess is along the RIGHT iliac vessels and the more superior abscesses within the leaves of the RIGHT abdominal small bowel mesentery. 3. New small fluid collection within the RIGHT inguinal hernia is concerning for abscess formation within the inguinal hernia sac 4. The appendix is thickened which may be secondary to adjacent inflammation. Missed appendicitis is still a consideration although again not favored over pelvic inflammatory disease. 5. Poor progression of oral contrast bowel is favored ileus over obstruction. 6. No evidence of pyelonephritis. 7. Bilateral pleural effusions slightly increased. 8. Moderate volume of fluid around the liver and pericolic gutters, similar. Electronically Signed   By: Genevive BiStewart  Edmunds M.D.   On: 03/10/2016 13:44    Anti-infectives: Anti-infectives    Start     Dose/Rate Route Frequency Ordered Stop   03/06/16 2330  doxycycline (VIBRA-TABS) tablet 100 mg  Status:  Discontinued     100 mg Oral Every 12 hours 03/06/16 2319 03/07/16 1518   03/06/16 1800  cefTRIAXone (ROCEPHIN) 1 g in dextrose 5 % 50 mL IVPB  Status:  Discontinued     1 g 100 mL/hr over 30 Minutes Intravenous Every 24 hours 03/05/16 1928 03/06/16 0904   03/06/16 1800  cefTRIAXone (ROCEPHIN) 2 g in dextrose 5 % 50 mL IVPB  Status:  Discontinued     2 g 100 mL/hr over 30 Minutes Intravenous Every 24 hours 03/06/16 0904 03/06/16 1059   03/06/16 1800  cefTRIAXone (ROCEPHIN) 1 g in dextrose 5 % 50 mL IVPB  Status:  Discontinued     1 g 100 mL/hr over 30 Minutes Intravenous Every 24 hours 03/06/16 1059 03/06/16 1059   03/06/16 1200  meropenem (MERREM) 1 g in sodium  chloride 0.9 % 100 mL IVPB     1 g 200 mL/hr over 30 Minutes Intravenous Every 8 hours 03/06/16 1105     03/05/16 1845  cefTRIAXone (ROCEPHIN) 1 g in dextrose 5 % 50 mL IVPB  Status:  Discontinued     1 g 100 mL/hr over 30 Minutes Intravenous  Once 03/05/16 1833 03/05/16 1840   03/05/16 1845  cefTRIAXone (ROCEPHIN) 2 g in dextrose 5 % 50 mL IVPB     2 g 100 mL/hr over 30 Minutes Intravenous  Once 03/05/16 1840 03/05/16 1919      Assessment/Plan: Multiple abs and pelvic abscesses not improving w A/Bs, I d/w IR Dr. Miles CostainShick in detail about the case and they will attempt to drain the abdominal collection and possible pelvic collection. This would be definitely in the pt best interest given that a laparotomy will carry significant morbidity and the risks of multiple enterocutaneous fistula given the degree on inflammation and the extent of the disease.  Hold lovenox, NPO and continue A/Bs for now No need for emergent surgical intervention. Time spent in this encounter more than 35 min with the majority of time allocated to reviewing multiple images and coordinating her care with multiple sub specialties. Counseling provided to the pt Sterling Bigiego Yaser Harvill, MD, [facility]  03/10/2016

## 2016-03-11 ENCOUNTER — Inpatient Hospital Stay: Payer: Medicaid Other

## 2016-03-11 LAB — CBC
HCT: 32 % — ABNORMAL LOW (ref 35.0–47.0)
HEMOGLOBIN: 11.2 g/dL — AB (ref 12.0–16.0)
MCH: 29.4 pg (ref 26.0–34.0)
MCHC: 34.9 g/dL (ref 32.0–36.0)
MCV: 84.1 fL (ref 80.0–100.0)
PLATELETS: 432 10*3/uL (ref 150–440)
RBC: 3.81 MIL/uL (ref 3.80–5.20)
RDW: 14.1 % (ref 11.5–14.5)
WBC: 20.8 10*3/uL — AB (ref 3.6–11.0)

## 2016-03-11 LAB — BODY FLUID CULTURE: Culture: NO GROWTH

## 2016-03-11 LAB — RAPID HIV SCREEN (HIV 1/2 AB+AG)
HIV 1/2 ANTIBODIES: NONREACTIVE
HIV-1 P24 ANTIGEN - HIV24: NONREACTIVE

## 2016-03-11 LAB — PROTIME-INR
INR: 1.11
PROTHROMBIN TIME: 14.3 s (ref 11.4–15.2)

## 2016-03-11 LAB — APTT: APTT: 35 s (ref 24–36)

## 2016-03-11 MED ORDER — FENTANYL CITRATE (PF) 100 MCG/2ML IJ SOLN
INTRAMUSCULAR | Status: AC
  Filled 2016-03-11: qty 4

## 2016-03-11 MED ORDER — FENTANYL CITRATE (PF) 100 MCG/2ML IJ SOLN
INTRAMUSCULAR | Status: AC | PRN
  Administered 2016-03-11 (×2): 50 ug via INTRAVENOUS

## 2016-03-11 MED ORDER — MIDAZOLAM HCL 2 MG/2ML IJ SOLN
INTRAMUSCULAR | Status: AC
  Filled 2016-03-11: qty 4

## 2016-03-11 MED ORDER — MIDAZOLAM HCL 2 MG/2ML IJ SOLN
INTRAMUSCULAR | Status: AC | PRN
  Administered 2016-03-11 (×2): 1 mg via INTRAVENOUS

## 2016-03-11 NOTE — Consult Note (Signed)
Chief Complaint: Patient was seen in consultation today for  Chief Complaint  Patient presents with  . Abdominal Pain   at the request of * No referring provider recorded for this case *  Referring Physician(s): * No referring provider recorded for this case *  Supervising Physician: Jolaine Click  Patient Status: Inpatient  History of Present Illness: Pamela [name] is a 35 y.o. female who presented with multiple abdominal abscesses. The working diagnosis is PID. She has not responded adequately to antibiotics. Perc drainage is requested. Imaging was reviewed and anterior approach should allow access.  Past Medical History:  Diagnosis Date  . Medical history non-contributory     Past Surgical History:  Procedure Laterality Date  . NO PAST SURGERIES      Allergies: Review of patient's allergies indicates no known allergies.  Medications: Prior to Admission medications   Not on File     Family History  Problem Relation Age of Onset  . Diabetes Neg Hx     Social History   Social History  . Marital status: Married    Spouse name: N/A  . Number of children: N/A  . Years of education: N/A   Social History Main Topics  . Smoking status: Never Smoker  . Smokeless tobacco: Never Used  . Alcohol use No  . Drug use: Unknown  . Sexual activity: Not Asked   Other Topics Concern  . None   Social History Narrative  . None     Review of Systems: A 12 point ROS discussed and pertinent positives are indicated in the HPI above.  All other systems are negative.  Review of Systems  Vital Signs: BP 126/82 (BP Location: Right Arm)   Pulse 64   Temp 98.2 F (36.8 C) (Oral)   Resp 18   Ht 5\' 2"  (1.575 m)   Wt 118 lb 2.7 oz (53.6 kg)   LMP 02/05/2016 Comment: neg preg test  SpO2 94%   BMI 21.61 kg/m   Physical Exam  Constitutional: She is oriented to person, place, and time. She appears well-developed and well-nourished.  Cardiovascular: Normal  rate and regular rhythm.   Pulmonary/Chest: Effort normal and breath sounds normal.  Abdominal: She exhibits distension. There is tenderness.  Neurological: She is alert and oriented to person, place, and time.  Skin: Skin is warm and dry.    Mallampati Score:   3  Imaging: Ct Abdomen Pelvis W Contrast  Result Date: 03/10/2016 CLINICAL DATA:  Pelvic abscess presumed related to pelvic inflammatory disease. Missed appendicitis also considered. EXAM: CT ABDOMEN AND PELVIS WITH CONTRAST TECHNIQUE: Multidetector CT imaging of the abdomen and pelvis was performed using the standard protocol following bolus administration of intravenous contrast. CONTRAST:  ISOVUE-300 IOPAMIDOL (ISOVUE-300) INJECTION 61% COMPARISON:  CT 03/06/2016 FINDINGS: Lower chest: Moderate bilateral pleural effusions increased from prior. Bibasilar associated passive atelectasis. No pericardial effusion. Hepatobiliary: Moderate volume of fluid surrounds the liver similar prior. No biliary duct dilatation. Gallbladder normal. Pancreas: Pancreas is normal. No ductal dilatation. No pancreatic inflammation. Spleen: Normal spleen Adrenals/urinary tract: Adrenal glands are normal. The kidneys enhance symmetrically. No evidence of pyelonephritis. No hydronephrosis. Bowel lateral mild hydroureter present. Bladder normal. Stomach/Bowel: Oral contrast in stomach. Duodenum and proximal small bowel are normal. There is mild dilatation of the proximal small bowel to 3.4 cm. Poor transit of oral contrast to the more distal small bowel. There is contrast within colon presumably from CT scan 4 days prior. No evidence  of perforation. No high-grade obstruction identified. Vascular/Lymphatic: Abdominal aorta is normal caliber. There is no retroperitoneal or periportal lymphadenopathy. No pelvic lymphadenopathy. Reproductive: Uterus is normal. There 2 complex fluid collections adjacent to the uterus with a tubular extensions the towards the uterine  body. These findings are most consistent with tubo-ovarian abscess with hydro or pyosalpinx. The RIGHT collection measures approximately 7.0 by 5.3 cm compared to 7.6 by 4.8 cm remeasured. The LEFT collection measures 4.6 by 3.2 cm compared to 4.7 by 2.8 cm. Visually these collections are very similar prior. More superiorly there is an intraperitoneal abscess in the RIGHT abdomen along the iliac vessels measuring 5.5 by of 3.8 cm compared to 5.2 by 4.0 cm. No significant change in size. There is enhancing rim of this abscess. Even more superiorly within the leaves of the small bowel mesentery of the RIGHT lower quadrant intraperitoneal abscess measures 6.0 by 5.9 cm increased from 5.0 by 5.0 cm. There is a tubular structure which extends from the region of the the cecum measuring a 10 mm diameter (image 66, series 2). This could represent the appendix. The thickening may be secondary inflammation due to the adjacent the pelvic infection. Other: New the round fluid collection within a RIGHT inguinal hernia measuring 2.5 cm with peripheral enhancement. Musculoskeletal: No aggressive osseous lesion. IMPRESSION: 1. Bilateral adnexal fluid collections with a tubular extension to the uterus suggest bilateral tubo-ovarian abscess with hydro or pyosalpinx. 2. Two intra-abdominal abscesses are not decreased in size compared to prior. The more inferior abscess is along the RIGHT iliac vessels and the more superior abscesses within the leaves of the RIGHT abdominal small bowel mesentery. 3. New small fluid collection within the RIGHT inguinal hernia is concerning for abscess formation within the inguinal hernia sac 4. The appendix is thickened which may be secondary to adjacent inflammation. Missed appendicitis is still a consideration although again not favored over pelvic inflammatory disease. 5. Poor progression of oral contrast bowel is favored ileus over obstruction. 6. No evidence of pyelonephritis. 7. Bilateral pleural  effusions slightly increased. 8. Moderate volume of fluid around the liver and pericolic gutters, similar. Electronically Signed   By: Genevive Bi M.D.   On: 03/10/2016 13:44   Ct Abdomen Pelvis W Contrast  Result Date: 03/06/2016 CLINICAL DATA:  Dysuria, abdominal pain, fever and suprapubic pain for approximately 10 days. EXAM: CT ABDOMEN AND PELVIS WITH CONTRAST TECHNIQUE: Multidetector CT imaging of the abdomen and pelvis was performed using the standard protocol following bolus administration of intravenous contrast. CONTRAST:  100 ml ISOVUE-300 IOPAMIDOL (ISOVUE-300) INJECTION 61% COMPARISON:  None. FINDINGS: There small bilateral pleural effusions. Associated compressive atelectasis in the lung bases is identified. There is a moderate volume of free abdominal fluid. Multiple fluid collections are identified in the pelvis. A collection just superior to the right adnexa in the pelvis measures approximately 6.0 cm AP by 3.1 cm transverse by 2.6 cm craniocaudal. A collection anterior to the rectum which extends cephalad into the left pelvis measures approximately 4.5 cm transverse by 2.1 cm AP by 6.3 cm craniocaudal. A septated collection centered in the right adnexa measures 5.2 cm AP x 4.5 cm transverse by approximately 3.4 cm craniocaudal. A third collection in the right lower quadrant measures 3.6 cm transverse by 2.4 cm AP by 2.5 cm craniocaudal. These collections may communicate. There is marked inflammatory change in mesenteric and omental fat in the pelvis. Multiple small bowel loops demonstrate wall thickening and hyperenhancement. Additionally, the walls of the ascending and  proximal transverse colon are hyperenhancing and thickened as on the walls of the sigmoid colon. The appendix is hyperenhancing on image 67. The uterus appears normal. No free intraperitoneal air, pneumatosis or portal venous gas is seen. The kidneys demonstrate homogeneous and normal enhancement. No hydronephrosis is  present. The urinary bladder is unremarkable. The gallbladder, liver, spleen pancreas, biliary tree and adrenal glands appear normal. No bony abnormality is identified. IMPRESSION: Constellation of findings most suggestive of pelvic inflammatory disease with tubo-ovarian abscess on the right and extensive abscess formation the abdomen and pelvis. Abnormal appearance of pelvic small bowel loops in the ascending and proximal transverse colon likely reflects secondary inflammation. Missed appendicitis is possible but thought less likely. Normal appearing kidneys.  No CT evidence of pyelonephritis. Small bilateral pleural effusions and compressive basilar atelectasis. Electronically Signed   By: Drusilla Kanner M.D.   On: 03/06/2016 18:25   US Renal  Result Date: 03/06/2016 CLINICAL DATA:  Acute pyelonephritis. EXAM: RENAL / URINARY TRACT ULTRASOUND COMPLETE COMPARISON:  None. FINDINGS: Right Kidney: Length: 10.3 cm. Echogenicity within normal limits. No mass or hydronephrosis visualized. Left Kidney: Length: 9.2 cm. Echogenicity within normal limits. No mass or hydronephrosis visualized. Bladder: Appears normal for degree of bladder distention. Bilateral ureteral jets are noted. Small amount of ascites is noted around the liver and spleen. IMPRESSION: Small amount of ascites is noted. Kidneys appear grossly normal without hydronephrosis or renal obstruction. Electronically Signed   By: Lupita Raider, M.D.   On: 03/06/2016 12:07   US Paracentesis  Result Date: 03/07/2016 INDICATION: Ascites and multifocal intra-abdominal abscess EXAM: ULTRASOUND GUIDED PARACENTESIS MEDICATIONS: None. ANESTHESIA/SEDATION: None . COMPLICATIONS: None immediate. PROCEDURE: Informed written consent was obtained from the patient after a discussion of the risks, benefits and alternatives to treatment. A timeout was performed prior to the initiation of the procedure. This patient has multiple intra abdominal fluid collections  although the majority of these are not amenable to percutaneous drainage. Request was made for paracentesis of the ascites to rule out perforation. Initial ultrasound scanning demonstrates a large amount of ascites within the right lower abdominal quadrant. The right upper abdomen was prepped and draped in the usual sterile fashion. 1% lidocaine with epinephrine was used for local anesthesia. Following this, an 18 gauge spinal needle was introduced under real-time ultrasound guidance. Confirmatory image was obtained. The paracentesis was performed. The needle was removed and a dressing was applied. The patient tolerated the procedure well without immediate post procedural complication. FINDINGS: A total of approximately 60 mL of cloudy yellow fluid was removed. Samples were sent to the laboratory as requested by the clinical team. IMPRESSION: Successful ultrasound-guided paracentesis yielding 60 mL of peritoneal fluid. Electronically Signed   By: Alcide Clever M.D.   On: 03/07/2016 13:32    Labs:  CBC:  Recent Labs  03/08/16 0938 03/09/16 0611 03/10/16 0608 03/11/16 0530  WBC 21.1* 23.2* 25.2* 20.8*  HGB 11.3* 11.8* 12.3 11.2*  HCT 32.2* 33.8* 35.6 32.0*  PLT 410 427 468* 432    COAGS:  Recent Labs  03/11/16 0950  INR 1.11  APTT 35    BMP:  Recent Labs  03/05/16 1710 03/06/16 0436 03/07/16 0523 03/09/16 0611  NA 135 136 135 136  K 4.0 3.8 4.1 4.0  CL 100* 109 104 102  CO2 25 21* 25 28  GLUCOSE 146* 127* 118* 77  BUN 8 8 10 9   CALCIUM 8.5* 7.1* 7.3* 7.2*  CREATININE 0.89 0.70 0.59 0.51  GFRNONAA >60 >  60 >60 >60  GFRAA >60 >60 >60 >60    LIVER FUNCTION TESTS:  Recent Labs  03/05/16 1710  BILITOT 0.3  AST 26  ALT 47  ALKPHOS 106  PROT 7.3  ALBUMIN 2.9*    TUMOR MARKERS: No results for input(s): AFPTM, CEA, CA199, CHROMGRNA in the last 8760 hours.  Assessment and Plan:  PID/TOA with multiple fluid collections. Will proceed with perc drainage. Interpreter  was present.  Thank you for this interesting consult.  I greatly enjoyed meeting Pamela [name] and look forward to participating in their care.  A copy of this report was sent to the requesting provider on this date.  Electronically Signed: Evvie Behrmann, ART A 03/11/2016, 3:54 PM   I spent a total of 40 Minutes  in face to face in clinical consultation, greater than 50% of which was counseling/coordinating care for abscess drain.

## 2016-03-11 NOTE — Consult Note (Signed)
[facility] Infectious Disease     Reason for Consult:  Pelvic abscess, ESBL E coli    Referring Physician: Estanislado Spire Date of Admission:  03/05/2016   Active Problems:   Sepsis ([place])   Diffuse abdominal pain   Pelvic abscess in female  Patient seen and interviewed with Interpreter Pamela [name]  HPI: Pamela [name] is a 35 y.o. female admitted 8/22 with fevers, abd pain and flank pain ongoing for one week prior to admission.  WBC on admit was 25, UA TNTC, UCX with ESBL E coli  Was felt to have pyelonephritis with US showing some ascites, CT showing possible abscess. Had aspiration 60 ml fluid with abundant wbc noted but cx neg.  Had repeat CT with continued fluid collections.  Has been on meropenem with wbc still elevated at 20.  Had 2 drains placed 8/28 WBC down to 16.4 HIV negative   Past Medical History:  Diagnosis Date  . Medical history non-contributory    Past Surgical History:  Procedure Laterality Date  . NO PAST SURGERIES     Social History  Substance Use Topics  . Smoking status: Never Smoker  . Smokeless tobacco: Never Used  . Alcohol use No   Family History  Problem Relation Age of Onset  . Diabetes Neg Hx     Allergies: No Known Allergies  Current antibiotics: Antibiotics Given (last 72 hours)    Date/Time Action Medication Dose Rate   03/08/16 1950 Given   meropenem (MERREM) 1 g in sodium chloride 0.9 % 100 mL IVPB 1 g 200 mL/hr   03/09/16 0439 Given   meropenem (MERREM) 1 g in sodium chloride 0.9 % 100 mL IVPB 1 g 200 mL/hr   03/09/16 1200 Given   meropenem (MERREM) 1 g in sodium chloride 0.9 % 100 mL IVPB 1 g 200 mL/hr   03/10/16 0330 Given   meropenem (MERREM) 1 g in sodium chloride 0.9 % 100 mL IVPB 1 g 200 mL/hr   03/10/16 1123 Given   meropenem (MERREM) 1 g in sodium chloride 0.9 % 100 mL IVPB 1 g 200 mL/hr   03/10/16 1952 Given   meropenem (MERREM) 1 g in sodium chloride 0.9 % 100 mL IVPB 1 g 200 mL/hr   03/11/16 0332 Given   meropenem (MERREM)  1 g in sodium chloride 0.9 % 100 mL IVPB 1 g 200 mL/hr   03/11/16 1150 Given   meropenem (MERREM) 1 g in sodium chloride 0.9 % 100 mL IVPB 1 g 200 mL/hr      MEDICATIONS: . fentaNYL      . midazolam      . feeding supplement (ENSURE ENLIVE)  237 mL Oral BID BM  . levothyroxine  50 mcg Oral QAC breakfast  . meropenem (MERREM) IV  1 g Intravenous Q8H  . phenazopyridine  100 mg Oral TID WC  . sodium chloride flush  3 mL Intravenous Q12H    Review of Systems - 11 systems reviewed and negative per HPI   OBJECTIVE: Temp:  [98.2 F (36.8 C)] 98.2 F (36.8 C) (08/28 1240) Pulse Rate:  [64] 64 (08/28 1240) Resp:  [17-18] 18 (08/28 1240) BP: (125-126)/(81-88) 126/82 (08/28 1240) SpO2:  [91 %-94 %] 94 % (08/28 1240) Physical Exam  Constitutional:  oriented to person, place, and time. appears well-developed and well-nourished. Moderately ill appearing HENT: [place]/AT, PERRLA, no scleral icterus Mouth/Throat: Oropharynx is clear and dry. No oropharyngeal exudate.  Cardiovascular: Normal rate, regular rhythm and normal heart  sounds.  Pulmonary/Chest: Effort normal and breath sounds normal. No respiratory distress.  has no wheezes.  Neck = supple, no nuchal rigidity Abdominal: distended,  JP drain RLQ, very ttp RLQ, mild TTP LLQ, Lymphadenopathy: no cervical adenopathy. No axillary adenopathy Neurological: alert and oriented to person, place, and time.  Skin: Skin is warm and dry. No rash noted. No erythema.  Psychiatric: a normal mood and affect.  behavior is normal.    LABS: Results for orders placed or performed during the hospital encounter of 03/05/16 (from the past 48 hour(s))  CBC     Status: Abnormal   Collection Time: 03/10/16  6:08 AM  Result Value Ref Range   WBC 25.2 (H) 3.6 - 11.0 K/uL   RBC 4.22 3.80 - 5.20 MIL/uL   Hemoglobin 12.3 12.0 - 16.0 g/dL   HCT 35.6 35.0 - 47.0 %   MCV 84.5 80.0 - 100.0 fL   MCH 29.1 26.0 - 34.0 pg   MCHC 34.4 32.0 - 36.0 g/dL   RDW 14.1 11.5  - 14.5 %   Platelets 468 (H) 150 - 440 K/uL  CBC     Status: Abnormal   Collection Time: 03/11/16  5:30 AM  Result Value Ref Range   WBC 20.8 (H) 3.6 - 11.0 K/uL   RBC 3.81 3.80 - 5.20 MIL/uL   Hemoglobin 11.2 (L) 12.0 - 16.0 g/dL   HCT 32.0 (L) 35.0 - 47.0 %   MCV 84.1 80.0 - 100.0 fL   MCH 29.4 26.0 - 34.0 pg   MCHC 34.9 32.0 - 36.0 g/dL   RDW 14.1 11.5 - 14.5 %   Platelets 432 150 - 440 K/uL  APTT     Status: None   Collection Time: 03/11/16  9:50 AM  Result Value Ref Range   aPTT 35 24 - 36 seconds  Protime-INR     Status: None   Collection Time: 03/11/16  9:50 AM  Result Value Ref Range   Prothrombin Time 14.3 11.4 - 15.2 seconds   INR 1.11    No components found for: ESR, C REACTIVE PROTEIN MICRO: Recent Results (from the past 720 hour(s))  Urine culture     Status: Abnormal   Collection Time: 03/05/16  5:10 PM  Result Value Ref Range Status   Specimen Description URINE, CLEAN CATCH  Final   Special Requests NONE  Final   Culture (A)  Final    >=100,000 COLONIES/mL ESCHERICHIA COLI Confirmed Extended Spectrum Beta-Lactamase Producer (ESBL) Performed at [facility]    Report Status 03/07/2016 FINAL  Final   Organism ID, Bacteria ESCHERICHIA COLI (A)  Final      Susceptibility   Escherichia coli - MIC*    AMPICILLIN >=32 RESISTANT Resistant     CEFAZOLIN >=64 RESISTANT Resistant     CEFTRIAXONE >=64 RESISTANT Resistant     CIPROFLOXACIN >=4 RESISTANT Resistant     GENTAMICIN <=1 SENSITIVE Sensitive     IMIPENEM <=0.25 SENSITIVE Sensitive     NITROFURANTOIN <=16 SENSITIVE Sensitive     TRIMETH/SULFA <=20 SENSITIVE Sensitive     AMPICILLIN/SULBACTAM >=32 RESISTANT Resistant     PIP/TAZO >=128 RESISTANT Resistant     Extended ESBL POSITIVE Resistant     * >=100,000 COLONIES/mL ESCHERICHIA COLI  Blood culture (routine x 2)     Status: None   Collection Time: 03/05/16  6:10 PM  Result Value Ref Range Status   Specimen Description BLOOD LEFT ARM  Final    Special Requests  Final    BOTTLES DRAWN AEROBIC AND ANAEROBIC AER [place] 4CC   Culture NO GROWTH 5 DAYS  Final   Report Status 03/10/2016 FINAL  Final  Blood culture (routine x 2)     Status: None   Collection Time: 03/05/16  6:20 PM  Result Value Ref Range Status   Specimen Description BLOOD LEFT AC  Final   Special Requests BOTTLES DRAWN AEROBIC AND ANAEROBIC AER [place]  Final   Culture NO GROWTH 5 DAYS  Final   Report Status 03/10/2016 FINAL  Final  MRSA PCR Screening     Status: None   Collection Time: 03/05/16  8:05 PM  Result Value Ref Range Status   MRSA by PCR NEGATIVE NEGATIVE Final    Comment:        The GeneXpert MRSA Assay (FDA approved for NASAL specimens only), is one component of a comprehensive MRSA colonization surveillance program. It is not intended to diagnose MRSA infection nor to guide or monitor treatment for MRSA infections.   Chlamydia/NGC rt PCR ([place] only)     Status: None   Collection Time: 03/07/16 10:54 AM  Result Value Ref Range Status   Specimen source GC/Chlam URINE, RANDOM  Final   Chlamydia Tr NOT DETECTED NOT DETECTED Final   N gonorrhoeae NOT DETECTED NOT DETECTED Final    Comment: (NOTE) 100  This methodology has not been evaluated in pregnant women or in 200  patients with a history of hysterectomy. 300 400  This methodology will not be performed on patients less than 58  years of age.   Body fluid culture     Status: None   Collection Time: 03/07/16 12:03 PM  Result Value Ref Range Status   Specimen Description PERITONEAL  Final   Special Requests NONE  Final   Gram Stain   Final    ABUNDANT WBC PRESENT,BOTH PMN AND MONONUCLEAR NO ORGANISMS SEEN    Culture   Final    No growth aerobically or anaerobically. Performed at [facility]    Report Status 03/11/2016 FINAL  Final    IMAGING: Ct Abdomen Pelvis W Contrast  Result Date: 03/10/2016 CLINICAL DATA:  Pelvic abscess presumed related to pelvic inflammatory  disease. Missed appendicitis also considered. EXAM: CT ABDOMEN AND PELVIS WITH CONTRAST TECHNIQUE: Multidetector CT imaging of the abdomen and pelvis was performed using the standard protocol following bolus administration of intravenous contrast. CONTRAST:  122m ISOVUE-300 IOPAMIDOL (ISOVUE-300) INJECTION 61% COMPARISON:  CT 03/06/2016 FINDINGS: Lower chest: Moderate bilateral pleural effusions increased from prior. Bibasilar associated passive atelectasis. No pericardial effusion. Hepatobiliary: Moderate volume of fluid surrounds the liver similar prior. No biliary duct dilatation. Gallbladder normal. Pancreas: Pancreas is normal. No ductal dilatation. No pancreatic inflammation. Spleen: Normal spleen Adrenals/urinary tract: Adrenal glands are normal. The kidneys enhance symmetrically. No evidence of pyelonephritis. No hydronephrosis. Bowel lateral mild hydroureter present. Bladder normal. Stomach/Bowel: Oral contrast in stomach. Duodenum and proximal small bowel are normal. There is mild dilatation of the proximal small bowel to 3.4 cm. Poor transit of oral contrast to the more distal small bowel. There is contrast within colon presumably from CT scan 4 days prior. No evidence of perforation. No high-grade obstruction identified. Vascular/Lymphatic: Abdominal aorta is normal caliber. There is no retroperitoneal or periportal lymphadenopathy. No pelvic lymphadenopathy. Reproductive: Uterus is normal. There 2 complex fluid collections adjacent to the uterus with a tubular extensions the towards the uterine body. These findings are most consistent with tubo-ovarian abscess with hydro or pyosalpinx.  The RIGHT collection measures approximately 7.0 by 5.3 cm compared to 7.6 by 4.8 cm remeasured. The LEFT collection measures 4.6 by 3.2 cm compared to 4.7 by 2.8 cm. Visually these collections are very similar prior. More superiorly there is an intraperitoneal abscess in the RIGHT abdomen along the iliac vessels  measuring 5.5 by of 3.8 cm compared to 5.2 by 4.0 cm. No significant change in size. There is enhancing rim of this abscess. Even more superiorly within the leaves of the small bowel mesentery of the RIGHT lower quadrant intraperitoneal abscess measures 6.0 by 5.9 cm increased from 5.0 by 5.0 cm. There is a tubular structure which extends from the region of the the cecum measuring a 10 mm diameter (image 66, series 2). This could represent the appendix. The thickening may be secondary inflammation due to the adjacent the pelvic infection. Other: New the round fluid collection within a RIGHT inguinal hernia measuring 2.5 cm with peripheral enhancement. Musculoskeletal: No aggressive osseous lesion. IMPRESSION: 1. Bilateral adnexal fluid collections with a tubular extension to the uterus suggest bilateral tubo-ovarian abscess with hydro or pyosalpinx. 2. Two intra-abdominal abscesses are not decreased in size compared to prior. The more inferior abscess is along the RIGHT iliac vessels and the more superior abscesses within the leaves of the RIGHT abdominal small bowel mesentery. 3. New small fluid collection within the RIGHT inguinal hernia is concerning for abscess formation within the inguinal hernia sac 4. The appendix is thickened which may be secondary to adjacent inflammation. Missed appendicitis is still a consideration although again not favored over pelvic inflammatory disease. 5. Poor progression of oral contrast bowel is favored ileus over obstruction. 6. No evidence of pyelonephritis. 7. Bilateral pleural effusions slightly increased. 8. Moderate volume of fluid around the liver and pericolic gutters, similar. Electronically Signed   By: Suzy Bouchard M.D.   On: 03/10/2016 13:44   Ct Abdomen Pelvis W Contrast  Result Date: 03/06/2016 CLINICAL DATA:  Dysuria, abdominal pain, fever and suprapubic pain for approximately 10 days. EXAM: CT ABDOMEN AND PELVIS WITH CONTRAST TECHNIQUE: Multidetector CT  imaging of the abdomen and pelvis was performed using the standard protocol following bolus administration of intravenous contrast. CONTRAST:  100 ml ISOVUE-300 IOPAMIDOL (ISOVUE-300) INJECTION 61% COMPARISON:  None. FINDINGS: There small bilateral pleural effusions. Associated compressive atelectasis in the lung bases is identified. There is a moderate volume of free abdominal fluid. Multiple fluid collections are identified in the pelvis. A collection just superior to the right adnexa in the pelvis measures approximately 6.0 cm AP by 3.1 cm transverse by 2.6 cm craniocaudal. A collection anterior to the rectum which extends cephalad into the left pelvis measures approximately 4.5 cm transverse by 2.1 cm AP by 6.3 cm craniocaudal. A septated collection centered in the right adnexa measures 5.2 cm AP x 4.5 cm transverse by approximately 3.4 cm craniocaudal. A third collection in the right lower quadrant measures 3.6 cm transverse by 2.4 cm AP by 2.5 cm craniocaudal. These collections may communicate. There is marked inflammatory change in mesenteric and omental fat in the pelvis. Multiple small bowel loops demonstrate wall thickening and hyperenhancement. Additionally, the walls of the ascending and proximal transverse colon are hyperenhancing and thickened as on the walls of the sigmoid colon. The appendix is hyperenhancing on image 67. The uterus appears normal. No free intraperitoneal air, pneumatosis or portal venous gas is seen. The kidneys demonstrate homogeneous and normal enhancement. No hydronephrosis is present. The urinary bladder is unremarkable. The gallbladder, liver,  spleen pancreas, biliary tree and adrenal glands appear normal. No bony abnormality is identified. IMPRESSION: Constellation of findings most suggestive of pelvic inflammatory disease with tubo-ovarian abscess on the right and extensive abscess formation the abdomen and pelvis. Abnormal appearance of pelvic small bowel loops in the  ascending and proximal transverse colon likely reflects secondary inflammation. Missed appendicitis is possible but thought less likely. Normal appearing kidneys.  No CT evidence of pyelonephritis. Small bilateral pleural effusions and compressive basilar atelectasis. Electronically Signed   By: Inge Rise M.D.   On: 03/06/2016 18:25   US Renal  Result Date: 03/06/2016 CLINICAL DATA:  Acute pyelonephritis. EXAM: RENAL / URINARY TRACT ULTRASOUND COMPLETE COMPARISON:  None. FINDINGS: Right Kidney: Length: 10.3 cm. Echogenicity within normal limits. No mass or hydronephrosis visualized. Left Kidney: Length: 9.2 cm. Echogenicity within normal limits. No mass or hydronephrosis visualized. Bladder: Appears normal for degree of bladder distention. Bilateral ureteral jets are noted. Small amount of ascites is noted around the liver and spleen. IMPRESSION: Small amount of ascites is noted. Kidneys appear grossly normal without hydronephrosis or renal obstruction. Electronically Signed   By: Marijo Conception, M.D.   On: 03/06/2016 12:07   US Paracentesis  Result Date: 03/07/2016 INDICATION: Ascites and multifocal intra-abdominal abscess EXAM: ULTRASOUND GUIDED PARACENTESIS MEDICATIONS: None. ANESTHESIA/SEDATION: None . COMPLICATIONS: None immediate. PROCEDURE: Informed written consent was obtained from the patient after a discussion of the risks, benefits and alternatives to treatment. A timeout was performed prior to the initiation of the procedure. This patient has multiple intra abdominal fluid collections although the majority of these are not amenable to percutaneous drainage. Request was made for paracentesis of the ascites to rule out perforation. Initial ultrasound scanning demonstrates a large amount of ascites within the right lower abdominal quadrant. The right upper abdomen was prepped and draped in the usual sterile fashion. 1% lidocaine with epinephrine was used for local anesthesia. Following this,  an 18 gauge spinal needle was introduced under real-time ultrasound guidance. Confirmatory image was obtained. The paracentesis was performed. The needle was removed and a dressing was applied. The patient tolerated the procedure well without immediate post procedural complication. FINDINGS: A total of approximately 60 mL of cloudy yellow fluid was removed. Samples were sent to the laboratory as requested by the clinical team. IMPRESSION: Successful ultrasound-guided paracentesis yielding 60 mL of peritoneal fluid. Electronically Signed   By: Inez Catalina M.D.   On: 03/07/2016 13:32    [place] is a 35 y.o. female with multiple abd abscesses, ESBL E coli UTI, not responding well to 7 days of potent abx, and 2 attempts at drainage percutaneously.   Recommendations Continue meropenem If no significant improvement on next imaging study would suggest surgical source control.  Thank you very much for allowing me to participate in the care of this patient. Please call with questions.   Cheral Marker. Ola Spurr, MD

## 2016-03-11 NOTE — Progress Notes (Signed)
[facility] - [place] at [facility]   PATIENT NAME: Pamela [name]    MR#:  [id number]  DATE OF BIRTH:  09/02/1980  SUBJECTIVE:  CHIEF COMPLAINT:   Chief Complaint  Patient presents with  . Abdominal Pain  Patient is a 35 year old Spanish female with not significant past medical history who presents to the hospital with complaints of abdominal pain and fever. Pain is described as 8 out of 10 by intensity and suprapubic area, associated with dysuria, nausea, vomiting, fevers and chills, she also admits of flank pain bilaterally, more on the left side. Her urinalysis revealed pyuria, concerning for acute pyelonephritis and patient was admitted for IV antibiotics. Ultrasound of the kidneys was unremarkable. But mild ascites. CT abdomen showed thicken and possible inflammation on right side colon, small intestine and some fluid collection, surgery want conservative management for now- US guided Ascites tap done ( 03/07/16)   The patient still complained of constant abdominal pain 10/10, but no nausea or vomiting, had BM.   Review of Systems  Constitutional: Negative for chills, fever and weight loss.  HENT: Negative for congestion.   Eyes: Negative for blurred vision and double vision.  Respiratory: Negative for cough, sputum production, shortness of breath and wheezing.   Cardiovascular: Negative for chest pain, palpitations, orthopnea, leg swelling and PND.  Gastrointestinal: Positive for abdominal pain. Negative for blood in stool, constipation, diarrhea, nausea and vomiting.  Genitourinary: Positive for flank pain. Negative for dysuria, frequency, hematuria and urgency.  Musculoskeletal: Negative for falls.  Neurological: Negative for dizziness, tremors, focal weakness and headaches.  Endo/Heme/Allergies: Does not bruise/bleed easily.  Psychiatric/Behavioral: Negative for depression. The patient does not have insomnia.     VITAL SIGNS: Blood pressure  126/82, pulse 64, temperature 98.2 F (36.8 C), temperature source Oral, resp. rate 18, height 5\' 2"  (1.575 m), weight 118 lb 2.7 oz (53.6 kg), last menstrual period 02/05/2016, SpO2 94 %.  PHYSICAL EXAMINATION:   GENERAL:  35 y.o.-year-old patient lying in the bed in moderate distress due to abdominal pain and flank pain, nausea.  EYES: Pupils equal, round, reactive to light and accommodation. No scleral icterus. Extraocular muscles intact.  HEENT: Head atraumatic, normocephalic. Oropharynx and nasopharynx clear.  NECK:  Supple, no jugular venous distention. No thyroid enlargement, no tenderness.  LUNGS: Normal breath sounds bilaterally, no wheezing, rales,rhonchi or crepitation. No use of accessory muscles of respiration.  CARDIOVASCULAR: S1, S2 normal. No murmurs, rubs, or gallops.  ABDOMEN: Soft, tender diffusely but no rebound, mild distended, hypoactive bowel sounds. No organomegaly or mass. Bilateral CVA tenderness on percussion. EXTREMITIES: No pedal edema, cyanosis, or clubbing.  NEUROLOGIC: Cranial nerves II through XII are intact. Muscle strength 5/5 in all extremities. Sensation intact. Gait not checked.  PSYCHIATRIC: The patient is alert and oriented x 3.  SKIN: No obvious rash, lesion, or ulcer.   ORDERS/RESULTS REVIEWED:   CBC  Recent Labs Lab 03/07/16 0523 03/08/16 0938 03/09/16 0611 03/10/16 0608 03/11/16 0530  WBC 25.8* 21.1* 23.2* 25.2* 20.8*  HGB 11.2* 11.3* 11.8* 12.3 11.2*  HCT 32.9* 32.2* 33.8* 35.6 32.0*  PLT 397 410 427 468* 432  MCV 84.7 84.5 84.6 84.5 84.1  MCH 28.9 29.7 29.4 29.1 29.4  MCHC 34.1 35.1 34.7 34.4 34.9  RDW 14.2 14.2 13.9 14.1 14.1   ------------------------------------------------------------------------------------------------------------------  Chemistries   Recent Labs Lab 03/05/16 1710 03/06/16 0436 03/07/16 0523 03/09/16 0611  NA 135 136 135 136  K 4.0 3.8 4.1 4.0  CL  100* 109 104 102  CO2 25 21* 25 28  GLUCOSE 146*  127* 118* 77  BUN 8 8 10 9   CREATININE 0.89 0.70 0.59 0.51  CALCIUM 8.5* 7.1* 7.3* 7.2*  AST 26  --   --   --   ALT 47  --   --   --   ALKPHOS 106  --   --   --   BILITOT 0.3  --   --   --    ------------------------------------------------------------------------------------------------------------------ estimated creatinine clearance is 76.9 mL/min (by C-G formula based on SCr of 0.8 mg/dL). ------------------------------------------------------------------------------------------------------------------ No results for input(s): TSH, T4TOTAL, T3FREE, THYROIDAB in the last 72 hours.  Invalid input(s): FREET3  Cardiac Enzymes No results for input(s): CKMB, TROPONINI, MYOGLOBIN in the last 168 hours.  Invalid input(s): CK ------------------------------------------------------------------------------------------------------------------ Invalid input(s): POCBNP ---------------------------------------------------------------------------------------------------------------  RADIOLOGY: Ct Abdomen Pelvis W Contrast  Result Date: 03/10/2016 CLINICAL DATA:  Pelvic abscess presumed related to pelvic inflammatory disease. Missed appendicitis also considered. EXAM: CT ABDOMEN AND PELVIS WITH CONTRAST TECHNIQUE: Multidetector CT imaging of the abdomen and pelvis was performed using the standard protocol following bolus administration of intravenous contrast. CONTRAST:  100mL ISOVUE-300 IOPAMIDOL (ISOVUE-300) INJECTION 61% COMPARISON:  CT 03/06/2016 FINDINGS: Lower chest: Moderate bilateral pleural effusions increased from prior. Bibasilar associated passive atelectasis. No pericardial effusion. Hepatobiliary: Moderate volume of fluid surrounds the liver similar prior. No biliary duct dilatation. Gallbladder normal. Pancreas: Pancreas is normal. No ductal dilatation. No pancreatic inflammation. Spleen: Normal spleen Adrenals/urinary tract: Adrenal glands are normal. The kidneys enhance symmetrically. No  evidence of pyelonephritis. No hydronephrosis. Bowel lateral mild hydroureter present. Bladder normal. Stomach/Bowel: Oral contrast in stomach. Duodenum and proximal small bowel are normal. There is mild dilatation of the proximal small bowel to 3.4 cm. Poor transit of oral contrast to the more distal small bowel. There is contrast within colon presumably from CT scan 4 days prior. No evidence of perforation. No high-grade obstruction identified. Vascular/Lymphatic: Abdominal aorta is normal caliber. There is no retroperitoneal or periportal lymphadenopathy. No pelvic lymphadenopathy. Reproductive: Uterus is normal. There 2 complex fluid collections adjacent to the uterus with a tubular extensions the towards the uterine body. These findings are most consistent with tubo-ovarian abscess with hydro or pyosalpinx. The RIGHT collection measures approximately 7.0 by 5.3 cm compared to 7.6 by 4.8 cm remeasured. The LEFT collection measures 4.6 by 3.2 cm compared to 4.7 by 2.8 cm. Visually these collections are very similar prior. More superiorly there is an intraperitoneal abscess in the RIGHT abdomen along the iliac vessels measuring 5.5 by of 3.8 cm compared to 5.2 by 4.0 cm. No significant change in size. There is enhancing rim of this abscess. Even more superiorly within the leaves of the small bowel mesentery of the RIGHT lower quadrant intraperitoneal abscess measures 6.0 by 5.9 cm increased from 5.0 by 5.0 cm. There is a tubular structure which extends from the region of the the cecum measuring a 10 mm diameter (image 66, series 2). This could represent the appendix. The thickening may be secondary inflammation due to the adjacent the pelvic infection. Other: New the round fluid collection within a RIGHT inguinal hernia measuring 2.5 cm with peripheral enhancement. Musculoskeletal: No aggressive osseous lesion. IMPRESSION: 1. Bilateral adnexal fluid collections with a tubular extension to the uterus suggest  bilateral tubo-ovarian abscess with hydro or pyosalpinx. 2. Two intra-abdominal abscesses are not decreased in size compared to prior. The more inferior abscess is along the RIGHT iliac vessels and the more superior  abscesses within the leaves of the RIGHT abdominal small bowel mesentery. 3. New small fluid collection within the RIGHT inguinal hernia is concerning for abscess formation within the inguinal hernia sac 4. The appendix is thickened which may be secondary to adjacent inflammation. Missed appendicitis is still a consideration although again not favored over pelvic inflammatory disease. 5. Poor progression of oral contrast bowel is favored ileus over obstruction. 6. No evidence of pyelonephritis. 7. Bilateral pleural effusions slightly increased. 8. Moderate volume of fluid around the liver and pericolic gutters, similar. Electronically Signed   By: Genevive Bi M.D.   On: 03/10/2016 13:44    EKG: No orders found for this or any previous visit.  ASSESSMENT AND PLAN:  Active Problems:   Sepsis (HCC)   Diffuse abdominal pain   Pelvic abscess in female  #1. Sepsis due to urinary tract infection and pelvic abscess ,   on meropenem, resistant E coli in urinary cultures, tuboovarian abscess or thickening of right side bowel walls on CT of abdomen and pelvis,   Blood cultures are negative so far, MRSA by PCR is negative, urine culture e coli.    Appreciated GYn and Surgery help.   continue IV meropenem. F/u ID consult. Repeat CT: Multiple abs and pelvic abscesses not improving. Per Dr. Everlene Farrier, will attempt to drain the abdominal collection and possible pelvic collection today.  #2 Acute pyelonephritis (ESBL), continue meropenem, follow-up CBC.  #3. Ascites of unclear etiology, questionable pelvic inflammatory disease, s/p US guided tap- wait for cx result- negative for 5 days.  #4. Leukocytosis, WBC is still high,  Follow up CBC.  #5. Metabolic acidosis, improved with intravenous  bicarbonate.  #6. Anemia, stable.  #7. Hyperglycemia, improved.  hemoglobin A1c 5.2  Ileus, per Dr. Everlene Farrier, continue NPO  Management plans discussed with the patient, family and they are in agreement.   DRUG ALLERGIES: No Known Allergies  CODE STATUS:     Code Status Orders        Start     Ordered   03/05/16 1838  Full code  Continuous     03/05/16 1838    Code Status History    Date Active Date Inactive Code Status Order ID Comments User Context   This patient has a current code status but no historical code status.      TOTAL TIME TAKING CARE OF THIS PATIENT: 38 minutes.    Shaune Pollack M.D on 03/11/2016 at 3:15 PM  Between 7am to 6pm - Pager - [phone]  After 6pm go to [email or web address] - password [email or web address] [facility]  [facility]  Office  [phone]  CC: Primary care physician; No PCP Per Patient

## 2016-03-12 LAB — BASIC METABOLIC PANEL
ANION GAP: 8 (ref 5–15)
BUN: 6 mg/dL (ref 6–20)
CALCIUM: 7.6 mg/dL — AB (ref 8.9–10.3)
CO2: 26 mmol/L (ref 22–32)
CREATININE: 0.5 mg/dL (ref 0.44–1.00)
Chloride: 101 mmol/L (ref 101–111)
GFR calc Af Amer: >60 mL/min (ref >60)
GFR calc non Af Amer: >60 mL/min (ref >60)
GLUCOSE: 65 mg/dL (ref 65–99)
Potassium: 4.3 mmol/L (ref 3.5–5.1)
Sodium: 135 mmol/L (ref 135–145)

## 2016-03-12 LAB — CBC
HEMATOCRIT: 32.4 % — AB (ref 35.0–47.0)
Hemoglobin: 11 g/dL — ABNORMAL LOW (ref 12.0–16.0)
MCH: 28.8 pg (ref 26.0–34.0)
MCHC: 34 g/dL (ref 32.0–36.0)
MCV: 84.7 fL (ref 80.0–100.0)
Platelets: 441 10*3/uL — ABNORMAL HIGH (ref 150–440)
RBC: 3.83 MIL/uL (ref 3.80–5.20)
RDW: 14.1 % (ref 11.5–14.5)
WBC: 16.4 10*3/uL — ABNORMAL HIGH (ref 3.6–11.0)

## 2016-03-12 NOTE — Progress Notes (Signed)
[facility] - [place] at [facility]   PATIENT NAME: Pamela [name]    MR#:  [id number]  DATE OF BIRTH:  [birth year]  SUBJECTIVE:  CHIEF COMPLAINT:   Chief Complaint  Patient presents with  . Abdominal Pain  Patient is a 35 year old Spanish female with not significant past medical history who presents to the hospital with complaints of abdominal pain and fever. Pain is described as 8 out of 10 by intensity and suprapubic area, associated with dysuria, nausea, vomiting, fevers and chills, she also admits of flank pain bilaterally, more on the left side. Her urinalysis revealed pyuria, concerning for acute pyelonephritis and patient was admitted for IV antibiotics. Ultrasound of the kidneys was unremarkable. But mild ascites. CT abdomen showed thicken and possible inflammation on right side colon, small intestine and some fluid collection, surgery want conservative management for now- US guided Ascites tap done ( 03/07/16)   The patient still complained of constant abdominal pain 9/10, especially on RLQ in drainage site.  Review of Systems  Constitutional: Negative for chills, fever and weight loss.  HENT: Negative for congestion.   Eyes: Negative for blurred vision and double vision.  Respiratory: Negative for cough, sputum production, shortness of breath and wheezing.   Cardiovascular: Negative for chest pain, palpitations, orthopnea, leg swelling and PND.  Gastrointestinal: Positive for abdominal pain. Negative for blood in stool, constipation, diarrhea, nausea and vomiting.  Genitourinary: Positive for flank pain. Negative for dysuria, frequency, hematuria and urgency.  Musculoskeletal: Negative for falls.  Neurological: Negative for dizziness, tremors, focal weakness and headaches.  Endo/Heme/Allergies: Does not bruise/bleed easily.  Psychiatric/Behavioral: Negative for depression. The patient does not have insomnia.     VITAL SIGNS: Blood pressure  120/72, pulse 76, temperature 98.2 F (36.8 C), temperature source Oral, resp. rate 19, height 5\' 2"  (1.575 m), weight 118 lb 2.7 oz (53.6 kg), last menstrual period 02/05/2016, SpO2 92 %.  PHYSICAL EXAMINATION:   GENERAL:  35 y.o.-year-old patient lying in the bed in moderate distress due to abdominal pain and flank pain, nausea.  EYES: Pupils equal, round, reactive to light and accommodation. No scleral icterus. Extraocular muscles intact.  HEENT: Head atraumatic, normocephalic. Oropharynx and nasopharynx clear.  NECK:  Supple, no jugular venous distention. No thyroid enlargement, no tenderness.  LUNGS: Normal breath sounds bilaterally, no wheezing, rales,rhonchi or crepitation. No use of accessory muscles of respiration.  CARDIOVASCULAR: S1, S2 normal. No murmurs, rubs, or gallops.  ABDOMEN: Soft, tender diffusely but no rebound, mild distended, hypoactive bowel sounds. No organomegaly or mass. Bilateral CVA tenderness on percussion. Yellowish fluid drainage on RLQ. EXTREMITIES: No pedal edema, cyanosis, or clubbing.  NEUROLOGIC: Cranial nerves II through XII are intact. Muscle strength 5/5 in all extremities. Sensation intact. Gait not checked.  PSYCHIATRIC: The patient is alert and oriented x 3.  SKIN: No obvious rash, lesion, or ulcer.   ORDERS/RESULTS REVIEWED:   CBC  Recent Labs Lab 03/08/16 0938 03/09/16 0611 03/10/16 0608 03/11/16 0530 03/12/16 0451  WBC 21.1* 23.2* 25.2* 20.8* 16.4*  HGB 11.3* 11.8* 12.3 11.2* 11.0*  HCT 32.2* 33.8* 35.6 32.0* 32.4*  PLT 410 427 468* 432 441*  MCV 84.5 84.6 84.5 84.1 84.7  MCH 29.7 29.4 29.1 29.4 28.8  MCHC 35.1 34.7 34.4 34.9 34.0  RDW 14.2 13.9 14.1 14.1 14.1   ------------------------------------------------------------------------------------------------------------------  Chemistries   Recent Labs Lab 03/05/16 1710 03/06/16 0436 03/07/16 0523 03/09/16 0611 03/12/16 0451  NA 135 136 135 136 135  K  4.0 3.8 4.1 4.0 4.3   CL 100* 109 104 102 101  CO2 25 21* 25 28 26   GLUCOSE 146* 127* 118* 77 65  BUN 8 8 10 9 6   CREATININE 0.89 0.70 0.59 0.51 0.50  CALCIUM 8.5* 7.1* 7.3* 7.2* 7.6*  AST 26  --   --   --   --   ALT 47  --   --   --   --   ALKPHOS 106  --   --   --   --   BILITOT 0.3  --   --   --   --    ------------------------------------------------------------------------------------------------------------------ estimated creatinine clearance is 76.9 mL/min (by C-G formula based on SCr of 0.8 mg/dL). ------------------------------------------------------------------------------------------------------------------ No results for input(s): TSH, T4TOTAL, T3FREE, THYROIDAB in the last 72 hours.  Invalid input(s): FREET3  Cardiac Enzymes No results for input(s): CKMB, TROPONINI, MYOGLOBIN in the last 168 hours.  Invalid input(s): CK ------------------------------------------------------------------------------------------------------------------ Invalid input(s): POCBNP ---------------------------------------------------------------------------------------------------------------  RADIOLOGY: Ct Image Guided Drainage By Percutaneous Catheter  Result Date: 03/12/2016 INDICATION: Pelvic abscess.  Right adnexal cysts. EXAM: CT GUIDED DRAINAGE OF PELVIC ABSCESS. CT-guided aspiration of a right adnexal cyst. MEDICATIONS: The patient is currently admitted to the hospital and receiving intravenous antibiotics. The antibiotics were administered within an appropriate time frame prior to the initiation of the procedure. ANESTHESIA/SEDATION: Three mg IV Versed 150 mcg IV Fentanyl Moderate Sedation Time:  31 The patient was continuously monitored during the procedure by the interventional radiology nurse under my direct supervision. COMPLICATIONS: None immediate. TECHNIQUE: Informed written consent was obtained from the patient after a thorough discussion of the procedural risks, benefits and alternatives. All  questions were addressed. Maximal Sterile Barrier Technique was utilized including caps, mask, sterile gowns, sterile gloves, sterile drape, hand hygiene and skin antiseptic. A timeout was performed prior to the initiation of the procedure. PROCEDURE: The lower abdomen was prepped with ChloraPrep in a sterile fashion, and a sterile drape was applied covering the operative field. A sterile gown and sterile gloves were used for the procedure. Local anesthesia was provided with 1% Lidocaine. Under CT guidance, an 18 gauge needle was inserted into the right lower quadrant abscess and removed over an Amplatz wire. A 12 French dilator followed by a 12 [place] drain were inserted. It was looped and string fixed. Frank pus was aspirated. Under CT guidance, an 18 gauge needle was inserted into the right adnexal cyst. Clear yellow fluid was aspirated. Samples were sent for culture. FINDINGS: Imaging documents drain placement into a right lower quadrant abscess and needle placement into a right adnexal cyst. IMPRESSION: Successful right lower quadrant abscess strain yielding pus Successful right adnexal cyst aspiration yielding clear yellow fluid. Sample was sent for culture. Electronically Signed   By: Jolaine Click M.D.   On: 03/12/2016 08:34    EKG: No orders found for this or any previous visit.  ASSESSMENT AND PLAN:  Active Problems:   Sepsis (HCC)   Diffuse abdominal pain   Pelvic abscess in female  #1. Sepsis due to urinary tract infection and pelvic abscess ,   on meropenem, resistant E coli in urinary cultures, tuboovarian abscess or thickening of right side bowel walls on CT of abdomen and pelvis,   Blood cultures are negative so far, MRSA by PCR is negative, urine culture e coli.    Appreciated GYn and Surgery help.   continue IV meropenem per Dr. Sampson Goon, Dr. Orvis Brill and Dr. Bonney Aid. Repeat CT: Multiple abs and pelvic  abscesses not improving. S/p drainage of abdominal collection. Leukocytosis is  better. May repeat abdominal US 3 days later per Dr. Bonney AidStaebler.  #2 Acute pyelonephritis (ESBL), continue meropenem, follow-up CBC.  #3. Ascites of unclear etiology, questionable pelvic inflammatory disease, s/p US guided tap- wait for cx result- negative for 5 days.  #4. Leukocytosis, WBC is still high,  Follow up CBC.  #5. Metabolic acidosis, improved with intravenous bicarbonate.  #6. Anemia, stable.  #7. Hyperglycemia, improved.  hemoglobin A1c 5.2  Ileus, keep NPO  Management plans discussed with the patient, family and they are in agreement.  Discussed with Dr. Sampson GoonFitzgerald, Dr. Orvis BrillLoflin and Dr. Bonney AidStaebler.  DRUG ALLERGIES: No Known Allergies  CODE STATUS:     Code Status Orders        Start     Ordered   03/05/16 1838  Full code  Continuous     03/05/16 1838    Code Status History    Date Active Date Inactive Code Status Order ID Comments User Context   This patient has a current code status but no historical code status.      TOTAL TIME TAKING CARE OF THIS PATIENT: 43 minutes.    Shaune Pollackhen, Taleah Bellantoni M.D on 03/12/2016 at 2:20 PM  Between 7am to 6pm - Pager - [phone]  After 6pm go to [email or web address] - password [email or web address] [facility]  [facility]  Office  [phone]  CC: Primary care physician; No PCP Per Patient

## 2016-03-12 NOTE — Progress Notes (Signed)
Case discussed with Dr. Imogene Burnhen.  At present I would continue present course of management with IV antibiotics (meropenem).  IR was able to successfully place a drain into the fluid collection yesterday.  I would re-image the fluid collected in 3-4 days with abdominal ultrasound to assess size and drain placement (verify decreasing size as well as proper placement of catheter).

## 2016-03-13 LAB — CBC
HEMATOCRIT: 33.3 % — AB (ref 35.0–47.0)
Hemoglobin: 11.5 g/dL — ABNORMAL LOW (ref 12.0–16.0)
MCH: 29.2 pg (ref 26.0–34.0)
MCHC: 34.6 g/dL (ref 32.0–36.0)
MCV: 84.5 fL (ref 80.0–100.0)
PLATELETS: 434 10*3/uL (ref 150–440)
RBC: 3.95 MIL/uL (ref 3.80–5.20)
RDW: 14.5 % (ref 11.5–14.5)
WBC: 14.8 10*3/uL — AB (ref 3.6–11.0)

## 2016-03-13 MED ORDER — SODIUM CHLORIDE 0.9% FLUSH
5.0000 mL | Freq: Three times a day (TID) | INTRAVENOUS | Status: DC
  Administered 2016-03-13 – 2016-03-15 (×8): 5 mL

## 2016-03-13 NOTE — Progress Notes (Signed)
Patient's pain seems to be improving after having the drain placement and her white count continues to decrease. Evaluating the CT scans likely from tubo-ovarian abscess. Surgery will be available for further consultation needed and if she does go to the operating room with gynecology will be happy to be available if they need any assistance.  Vitals:   03/13/16 1237 03/13/16 2018  BP: 124/76 131/80  Pulse: 67 74  Resp:  18  Temp: 99.1 F (37.3 C) 98.4 F (36.9 C)    PE: Gen: NAD Abd: soft, moderately distended, tender in bilateral lower quadrants, seropurulent drainage from drain  CBC Latest Ref Rng & Units 03/13/2016 03/12/2016 03/11/2016  WBC 3.6 - 11.0 K/uL 14.8(H) 16.4(H) 20.8(H)  Hemoglobin 12.0 - 16.0 g/dL 11.5(L) 11.0(L) 11.2(L)  Hematocrit 35.0 - 47.0 % 33.3(L) 32.4(L) 32.0(L)  Platelets 150 - 440 K/uL 434 441(H) 432   CMP Latest Ref Rng & Units 03/12/2016 03/09/2016 03/07/2016  Glucose 65 - 99 mg/dL 65 77 914(N118(H)  BUN 6 - 20 mg/dL 6 9 10   Creatinine 0.44 - 1.00 mg/dL 8.290.50 5.620.51 1.300.59  Sodium 135 - 145 mmol/L 135 136 135  Potassium 3.5 - 5.1 mmol/L 4.3 4.0 4.1  Chloride 101 - 111 mmol/L 101 102 104  CO2 22 - 32 mmol/L 26 28 25   Calcium 8.9 - 10.3 mg/dL 7.6(L) 7.2(L) 7.3(L)  Total Protein 6.5 - 8.1 g/dL - - -  Total Bilirubin 0.3 - 1.2 mg/dL - - -  Alkaline Phos 38 - 126 U/L - - -  AST 15 - 41 U/L - - -  ALT 14 - 54 U/L - - -

## 2016-03-13 NOTE — Progress Notes (Signed)
Pharmacy Antibiotic Note  Pamela [name] is a 35 y.o. female admitted on 03/05/2016 with sepsis/UT/pelvic abscess .  Pharmacy has been consulted for meropnem dosing.  Plan: Meropenem 1 g iv q 8 hours.    Height: 5\' 2"  (157.5 cm) Weight: 118 lb 2.7 oz (53.6 kg) IBW/kg (Calculated) : 50.1  Temp (24hrs), Avg:98.3 F (36.8 C), Min:98.2 F (36.8 C), Max:98.5 F (36.9 C)   Recent Labs Lab 03/07/16 0523  03/09/16 0611 03/10/16 0608 03/11/16 0530 03/12/16 0451 03/13/16 0451  WBC 25.8*  < > 23.2* 25.2* 20.8* 16.4* 14.8*  CREATININE 0.59  --  0.51  --   --  0.50  --   < > = values in this interval not displayed.  Estimated Creatinine Clearance: 76.9 mL/min (by C-G formula based on SCr of 0.8 mg/dL).    No Known Allergies  Antimicrobials this admission: Doxycyline 8/24 >> 8/24 Ceftriaxone 8/22 >> 8/23 Meropenem 8/23 >>   Microbiology results: 8/22 BCx: NGTD x 2 8/22 UCx: ESBL E coli 8/22 MRSA PCR: negative 8/24: peritoneal fluid: NGTD 8/24 GC: negative  Thank you for allowing pharmacy to be a part of this patient's care.  Marty HeckWang, Ashelynn Marks L 03/13/2016 11:38 AM

## 2016-03-13 NOTE — Progress Notes (Signed)
[facility] - [place] at [facility]   PATIENT NAME: Pamela [name]    MR#:  [id number]  DATE OF BIRTH:  10-07-1980  SUBJECTIVE:  CHIEF COMPLAINT:   Chief Complaint  Patient presents with  . Abdominal Pain  Patient is a 35 year old Spanish female with not significant past medical history who presents to the hospital with complaints of abdominal pain and fever. Pain is described as 8 out of 10 by intensity and suprapubic area, associated with dysuria, nausea, vomiting, fevers and chills, she also admits of flank pain bilaterally, more on the left side. Her urinalysis revealed pyuria, concerning for acute pyelonephritis and patient was admitted for IV antibiotics. Ultrasound of the kidneys was unremarkable. But mild ascites. CT abdomen showed thicken and possible inflammation on right side colon, small intestine and some fluid collection, surgery want conservative management for now- US guided Ascites tap done ( 03/07/16)   The patient still complained of constant abdominal pain 6-9/10, bilateral flank pain.  Review of Systems  Constitutional: Negative for chills, fever and weight loss.  HENT: Negative for congestion.   Eyes: Negative for blurred vision and double vision.  Respiratory: Negative for cough, sputum production, shortness of breath and wheezing.   Cardiovascular: Negative for chest pain, palpitations, orthopnea, leg swelling and PND.  Gastrointestinal: Positive for abdominal pain. Negative for blood in stool, constipation, diarrhea, nausea and vomiting.  Genitourinary: Positive for flank pain. Negative for dysuria, frequency, hematuria and urgency.  Musculoskeletal: Negative for falls.  Neurological: Negative for dizziness, tremors, focal weakness and headaches.  Endo/Heme/Allergies: Does not bruise/bleed easily.  Psychiatric/Behavioral: Negative for depression. The patient does not have insomnia.     VITAL SIGNS: Blood pressure 124/76, pulse 67,  temperature 99.1 F (37.3 C), temperature source Oral, resp. rate 20, height 5\' 2"  (1.575 m), weight 118 lb 2.7 oz (53.6 kg), last menstrual period 02/05/2016, SpO2 91 %.  PHYSICAL EXAMINATION:   GENERAL:  35 y.o.-year-old patient lying in the bed in moderate distress due to abdominal pain and flank pain, nausea.  EYES: Pupils equal, round, reactive to light and accommodation. No scleral icterus. Extraocular muscles intact.  HEENT: Head atraumatic, normocephalic. Oropharynx and nasopharynx clear.  NECK:  Supple, no jugular venous distention. No thyroid enlargement, no tenderness.  LUNGS: Normal breath sounds bilaterally, no wheezing, rales,rhonchi or crepitation. No use of accessory muscles of respiration.  CARDIOVASCULAR: S1, S2 normal. No murmurs, rubs, or gallops.  ABDOMEN: Soft, tender diffusely but no rebound, mild distended, hypoactive bowel sounds. Bilateral CVA tenderness on percussion. Yellowish fluid drainage on RLQ. EXTREMITIES: No pedal edema, cyanosis, or clubbing.  NEUROLOGIC: Cranial nerves II through XII are intact. Muscle strength 5/5 in all extremities. Sensation intact. Gait not checked.  PSYCHIATRIC: The patient is alert and oriented x 3.  SKIN: No obvious rash, lesion, or ulcer.   ORDERS/RESULTS REVIEWED:   CBC  Recent Labs Lab 03/09/16 0611 03/10/16 0608 03/11/16 0530 03/12/16 0451 03/13/16 0451  WBC 23.2* 25.2* 20.8* 16.4* 14.8*  HGB 11.8* 12.3 11.2* 11.0* 11.5*  HCT 33.8* 35.6 32.0* 32.4* 33.3*  PLT 427 468* 432 441* 434  MCV 84.6 84.5 84.1 84.7 84.5  MCH 29.4 29.1 29.4 28.8 29.2  MCHC 34.7 34.4 34.9 34.0 34.6  RDW 13.9 14.1 14.1 14.1 14.5   ------------------------------------------------------------------------------------------------------------------  Chemistries   Recent Labs Lab 03/07/16 0523 03/09/16 0611 03/12/16 0451  NA 135 136 135  K 4.1 4.0 4.3  CL 104 102 101  CO2 25 28 26  GLUCOSE 118* 77 65  BUN 10 9 6   CREATININE 0.59 0.51  0.50  CALCIUM 7.3* 7.2* 7.6*   ------------------------------------------------------------------------------------------------------------------ estimated creatinine clearance is 76.9 mL/min (by C-G formula based on SCr of 0.8 mg/dL). ------------------------------------------------------------------------------------------------------------------ No results for input(s): TSH, T4TOTAL, T3FREE, THYROIDAB in the last 72 hours.  Invalid input(s): FREET3  Cardiac Enzymes No results for input(s): CKMB, TROPONINI, MYOGLOBIN in the last 168 hours.  Invalid input(s): CK ------------------------------------------------------------------------------------------------------------------ Invalid input(s): POCBNP ---------------------------------------------------------------------------------------------------------------  RADIOLOGY: Ct Image Guided Drainage By Percutaneous Catheter  Result Date: 03/12/2016 INDICATION: Pelvic abscess.  Right adnexal cysts. EXAM: CT GUIDED DRAINAGE OF PELVIC ABSCESS. CT-guided aspiration of a right adnexal cyst. MEDICATIONS: The patient is currently admitted to the hospital and receiving intravenous antibiotics. The antibiotics were administered within an appropriate time frame prior to the initiation of the procedure. ANESTHESIA/SEDATION: Three mg IV Versed 150 mcg IV Fentanyl Moderate Sedation Time:  31 The patient was continuously monitored during the procedure by the interventional radiology nurse under my direct supervision. COMPLICATIONS: None immediate. TECHNIQUE: Informed written consent was obtained from the patient after a thorough discussion of the procedural risks, benefits and alternatives. All questions were addressed. Maximal Sterile Barrier Technique was utilized including caps, mask, sterile gowns, sterile gloves, sterile drape, hand hygiene and skin antiseptic. A timeout was performed prior to the initiation of the procedure. PROCEDURE: The lower abdomen was  prepped with ChloraPrep in a sterile fashion, and a sterile drape was applied covering the operative field. A sterile gown and sterile gloves were used for the procedure. Local anesthesia was provided with 1% Lidocaine. Under CT guidance, an 18 gauge needle was inserted into the right lower quadrant abscess and removed over an Amplatz wire. A 12 French dilator followed by a 12 [place] drain were inserted. It was looped and string fixed. Frank pus was aspirated. Under CT guidance, an 18 gauge needle was inserted into the right adnexal cyst. Clear yellow fluid was aspirated. Samples were sent for culture. FINDINGS: Imaging documents drain placement into a right lower quadrant abscess and needle placement into a right adnexal cyst. IMPRESSION: Successful right lower quadrant abscess strain yielding pus Successful right adnexal cyst aspiration yielding clear yellow fluid. Sample was sent for culture. Electronically Signed   By: Jolaine ClickArthur  Hoss M.D.   On: 03/12/2016 08:34    EKG: No orders found for this or any previous visit.  ASSESSMENT AND PLAN:  Active Problems:   Sepsis (HCC)   Diffuse abdominal pain   Pelvic abscess in female  #1. Sepsis due to urinary tract infection and pelvic abscess ,   on meropenem, resistant E coli in urinary cultures, tuboovarian abscess or thickening of right side bowel walls on CT of abdomen and pelvis,   Blood cultures are negative so far, MRSA by PCR is negative, urine culture e coli.    Appreciated GYn and Surgery help.   continue IV meropenem per Dr. Sampson GoonFitzgerald, Dr. Orvis BrillLoflin and Dr. Bonney AidStaebler. Repeat CT: Multiple abs and pelvic abscesses not improving. S/p drainage of abdominal collection. Leukocytosis is better. May repeat abdominal US 3 days later per Dr. Bonney AidStaebler.  #2 Acute pyelonephritis (ESBL), continue meropenem, follow-up CBC.  #3. Ascites of unclear etiology, questionable pelvic inflammatory disease, s/p US guided tap- wait for cx result- negative for 5  days.  #4. Leukocytosis, improving,  Follow up CBC.  #5. Metabolic acidosis, improved with intravenous bicarbonate.  #6. Anemia, stable.  #7. Hyperglycemia, improved.  hemoglobin A1c 5.2  Ileus, had BM twice. Keep nothing  by mouth..  Management plans discussed with the patient, family and they are in agreement.  Discussed with Dr. Sampson Goon, Dr. Orvis Brill and Dr. Bonney Aid.  DRUG ALLERGIES: No Known Allergies  CODE STATUS:     Code Status Orders        Start     Ordered   03/05/16 1838  Full code  Continuous     03/05/16 1838    Code Status History    Date Active Date Inactive Code Status Order ID Comments User Context   This patient has a current code status but no historical code status.      TOTAL TIME TAKING CARE OF THIS PATIENT: 42 minutes.    Shaune Pollack M.D on 03/13/2016 at 2:49 PM  Between 7am to 6pm - Pager - [phone]  After 6pm go to [email or web address] - password [email or web address] [facility]  [place] [facility]  Office  [phone]  CC: Primary care physician; No PCP Per Patient

## 2016-03-14 ENCOUNTER — Inpatient Hospital Stay: Payer: Medicaid Other

## 2016-03-14 LAB — PHOSPHORUS: Phosphorus: 3.2 mg/dL (ref 2.5–4.6)

## 2016-03-14 LAB — CBC
HCT: 35 % (ref 35.0–47.0)
Hemoglobin: 12.2 g/dL (ref 12.0–16.0)
MCH: 29.5 pg (ref 26.0–34.0)
MCHC: 34.8 g/dL (ref 32.0–36.0)
MCV: 84.9 fL (ref 80.0–100.0)
Platelets: 427 10*3/uL (ref 150–440)
RBC: 4.13 MIL/uL (ref 3.80–5.20)
RDW: 14.5 % (ref 11.5–14.5)
WBC: 17.1 10*3/uL — ABNORMAL HIGH (ref 3.6–11.0)

## 2016-03-14 LAB — BASIC METABOLIC PANEL
Anion gap: 10 (ref 5–15)
BUN: 7 mg/dL (ref 6–20)
CO2: 23 mmol/L (ref 22–32)
Calcium: 8 mg/dL — ABNORMAL LOW (ref 8.9–10.3)
Chloride: 100 mmol/L — ABNORMAL LOW (ref 101–111)
Creatinine, Ser: 0.7 mg/dL (ref 0.44–1.00)
GFR calc Af Amer: >60 mL/min (ref >60)
GFR calc non Af Amer: >60 mL/min (ref >60)
Glucose, Bld: 71 mg/dL (ref 65–99)
Potassium: 4.8 mmol/L (ref 3.5–5.1)
Sodium: 133 mmol/L — ABNORMAL LOW (ref 135–145)

## 2016-03-14 LAB — TSH: TSH: 58.964 u[IU]/mL — AB (ref 0.350–4.500)

## 2016-03-14 LAB — MAGNESIUM: Magnesium: 1.8 mg/dL (ref 1.7–2.4)

## 2016-03-14 MED ORDER — LEVOTHYROXINE SODIUM 50 MCG PO TABS
50.0000 ug | ORAL_TABLET | Freq: Once | ORAL | Status: AC
  Administered 2016-03-14: 50 ug via ORAL
  Filled 2016-03-14: qty 1

## 2016-03-14 MED ORDER — LEVOTHYROXINE SODIUM 100 MCG PO TABS
100.0000 ug | ORAL_TABLET | Freq: Every day | ORAL | Status: DC
  Administered 2016-03-15 – 2016-03-16 (×2): 100 ug via ORAL
  Filled 2016-03-14 (×2): qty 1

## 2016-03-14 MED ORDER — ENOXAPARIN SODIUM 40 MG/0.4ML ~~LOC~~ SOLN
40.0000 mg | SUBCUTANEOUS | Status: DC
  Administered 2016-03-14 – 2016-03-15 (×2): 40 mg via SUBCUTANEOUS
  Filled 2016-03-14 (×2): qty 0.4

## 2016-03-14 NOTE — Care Management (Signed)
Plan for repeat US today.  May potentially advanced patient's diet.  RNCM following for discharge needs.

## 2016-03-14 NOTE — Progress Notes (Addendum)
[facility] - [place] at [facility]   PATIENT NAME: Pamela [name]    MR#:  [id number]  DATE OF BIRTH:  [birth year]  SUBJECTIVE:  CHIEF COMPLAINT:   Chief Complaint  Patient presents with  . Abdominal Pain  Patient is a 35 year old Spanish female with not significant past medical history who presents to the hospital with complaints of abdominal pain and fever. Pain is described as 8 out of 10 by intensity and suprapubic area, associated with dysuria, nausea, vomiting, fevers and chills, she also admits of flank pain bilaterally, more on the left side. Her urinalysis revealed pyuria, concerning for acute pyelonephritis and patient was admitted for IV antibiotics. Ultrasound of the kidneys was unremarkable. But mild ascites. CT abdomen showed thicken and possible inflammation on right side colon, small intestine and some fluid collection, surgery want conservative management for now- US guided Ascites tap done ( 03/07/16)  better abdominal pain 6/10, bilateral flank pain. Hungry.  Review of Systems  Constitutional: Negative for chills, fever and weight loss.  HENT: Negative for congestion.   Eyes: Negative for blurred vision and double vision.  Respiratory: Negative for cough, sputum production, shortness of breath and wheezing.   Cardiovascular: Negative for chest pain, palpitations, orthopnea, leg swelling and PND.  Gastrointestinal: Positive for abdominal pain. Negative for blood in stool, constipation, diarrhea, nausea and vomiting.  Genitourinary: Positive for flank pain. Negative for dysuria, frequency, hematuria and urgency.  Musculoskeletal: Negative for falls.  Neurological: Negative for dizziness, tremors, focal weakness and headaches.  Endo/Heme/Allergies: Does not bruise/bleed easily.  Psychiatric/Behavioral: Negative for depression. The patient does not have insomnia.     VITAL SIGNS: Blood pressure 118/74, pulse 70, temperature 98 F (36.7 C),  temperature source Oral, resp. rate 16, height 5\' 2"  (1.575 m), weight 118 lb 2.7 oz (53.6 kg), last menstrual period 02/05/2016, SpO2 94 %.  PHYSICAL EXAMINATION:   GENERAL:  35 y.o.-year-old patient lying in the bed in moderate distress due to abdominal pain and flank pain, nausea.  EYES: Pupils equal, round, reactive to light and accommodation. No scleral icterus. Extraocular muscles intact.  HEENT: Head atraumatic, normocephalic. Oropharynx and nasopharynx clear.  NECK:  Supple, no jugular venous distention. No thyroid enlargement, no tenderness.  LUNGS: Normal breath sounds bilaterally, no wheezing, rales,rhonchi or crepitation. No use of accessory muscles of respiration.  CARDIOVASCULAR: S1, S2 normal. No murmurs, rubs, or gallops.  ABDOMEN: Soft, tender diffusely but no rebound, mild distended, hypoactive bowel sounds. Bilateral CVA tenderness on percussion. Yellowish fluid drainage on RLQ. EXTREMITIES: No pedal edema, cyanosis, or clubbing.  NEUROLOGIC: Cranial nerves II through XII are intact. Muscle strength 5/5 in all extremities. Sensation intact. Gait not checked.  PSYCHIATRIC: The patient is alert and oriented x 3.  SKIN: No obvious rash, lesion, or ulcer.   ORDERS/RESULTS REVIEWED:   CBC  Recent Labs Lab 03/10/16 0608 03/11/16 0530 03/12/16 0451 03/13/16 0451 03/14/16 0445  WBC 25.2* 20.8* 16.4* 14.8* 17.1*  HGB 12.3 11.2* 11.0* 11.5* 12.2  HCT 35.6 32.0* 32.4* 33.3* 35.0  PLT 468* 432 441* 434 427  MCV 84.5 84.1 84.7 84.5 84.9  MCH 29.1 29.4 28.8 29.2 29.5  MCHC 34.4 34.9 34.0 34.6 34.8  RDW 14.1 14.1 14.1 14.5 14.5   ------------------------------------------------------------------------------------------------------------------  Chemistries   Recent Labs Lab 03/09/16 0611 03/12/16 0451 03/14/16 0445  NA 136 135 133*  K 4.0 4.3 4.8  CL 102 101 100*  CO2 28 26 23   GLUCOSE 77 65 71  BUN 9 6 7   CREATININE 0.51 0.50 0.70  CALCIUM 7.2* 7.6* 8.0*  MG   --   --  1.8   ------------------------------------------------------------------------------------------------------------------ estimated creatinine clearance is 76.9 mL/min (by C-G formula based on SCr of 0.8 mg/dL). ------------------------------------------------------------------------------------------------------------------  Recent Labs  03/14/16 0445  TSH 58.964*    Cardiac Enzymes No results for input(s): CKMB, TROPONINI, MYOGLOBIN in the last 168 hours.  Invalid input(s): CK ------------------------------------------------------------------------------------------------------------------ Invalid input(s): POCBNP ---------------------------------------------------------------------------------------------------------------  RADIOLOGY: No results found.  EKG: No orders found for this or any previous visit.  ASSESSMENT AND PLAN:  Active Problems:   Sepsis (HCC)   Diffuse abdominal pain   Pelvic abscess in female  #1. Sepsis due to urinary tract infection and pelvic abscess ,   on meropenem, resistant E coli in urinary cultures, tuboovarian abscess or thickening of right side bowel walls on CT of abdomen and pelvis,   Blood cultures are negative so far, MRSA by PCR is negative, urine culture e coli.    Appreciated GYn and Surgery help.   continue IV meropenem per Dr. Sampson GoonFitzgerald, Dr. Orvis BrillLoflin and Dr. Bonney AidStaebler. Repeat CT: Multiple abs and pelvic abscesses not improving. S/p drainage of abdominal collection. Leukocytosis is better. repeat abdominal and pelvic US today.  #2 Acute pyelonephritis (ESBL), continue meropenem, follow-up CBC.  #3. Ascites of unclear etiology, questionable pelvic inflammatory disease, s/p US guided tap- wait for cx result- negative.  #4. Leukocytosis, improving,  Follow up CBC.  #5. Metabolic acidosis, improved with intravenous bicarbonate.  #6. Anemia, stable.  #7. Hyperglycemia, improved.  hemoglobin A1c 5.2  Ileus, had BM twice. May  start diet.  Hyponatremia. Continue normal saline and follow-up BMP.  Elevated TSH 58.964, follow-up T3-T4 and the consult.  Management plans discussed with the patient, family and they are in agreement.   DRUG ALLERGIES: No Known Allergies  CODE STATUS:     Code Status Orders        Start     Ordered   03/05/16 1838  Full code  Continuous     03/05/16 1838    Code Status History    Date Active Date Inactive Code Status Order ID Comments User Context   This patient has a current code status but no historical code status.      TOTAL TIME TAKING CARE OF THIS PATIENT: 38 minutes.    Shaune Pollackhen, Katarzyna Wolven M.D on 03/14/2016 at 2:50 PM  Between 7am to 6pm - Pager - [phone]  After 6pm go to [email or web address] - password [email or web address] [facility]  [place] [facility]  Office  [phone]  CC: Primary care physician; No PCP Per Patient

## 2016-03-14 NOTE — Progress Notes (Signed)
Nutrition Follow-up  DOCUMENTATION CODES:   Not applicable  INTERVENTION:  -Pt with inadequate nutrition x 10 days (Pt NPO/CL since 8/25, minimal po intake prior to this since admission 8/22); +BM, no vomiting, some pain in abdominal region; discussed with MD Imogene Burnhen, MD is aware of inadequate nutrition. Plan for US abdomen today and hopefully diet advancement after. If unable to advance diet or if pt does not tolerate po, recommend consideration of nutrition support   NUTRITION DIAGNOSIS:   Inadequate oral intake related to poor appetite, acute illness as evidenced by per patient/family report.  Continues  GOAL:   Patient will meet greater than or equal to 90% of their needs  MONITOR:   PO intake, Supplement acceptance, Labs, Weight trends  REASON FOR ASSESSMENT:   Malnutrition Screening Tool    ASSESSMENT:   35 yo female admitted with abdominal pain and fever, acute pyelonephritis, pelvic abscess and ascites  Percutaneous drainage of abscess; pt complains of pelvic pain; pt remains NPO  Diet Order:  Diet NPO time specified Except for: Sips with Meds  Skin:  Reviewed, no issues  Last BM:  8/30   Labs: sodium 133  Meds: NS with KCl at 125 ml/hr  Height:   Ht Readings from Last 1 Encounters:  03/05/16 5\' 2"  (1.575 m)    Weight:   Wt Readings from Last 1 Encounters:  03/07/16 118 lb 2.7 oz (53.6 kg)   Filed Weights   03/05/16 1707 03/05/16 2000 03/07/16 0349  Weight: 110 lb (49.9 kg) 119 lb 14.9 oz (54.4 kg) 118 lb 2.7 oz (53.6 kg)   BMI:  Body mass index is 21.61 kg/m.  Estimated Nutritional Needs:   Kcal:  1600-1900 kcals  Protein:  70-90 g  Fluid:  >/= 1.6 L  EDUCATION NEEDS:   No education needs identified at this time  Romelle StarcherCate Glenda Spelman MS, RD, LDN [phone] Pager  [phone] Weekend/On-Call Pager

## 2016-03-14 NOTE — Progress Notes (Signed)
28 ounces of 32 ounces given to pt to drink for U/S. Once pt finishes this, I will give the additional 4 ounces.

## 2016-03-15 ENCOUNTER — Ambulatory Visit (HOSPITAL_COMMUNITY)
Admission: AD | Admit: 2016-03-15 | Discharge: 2016-03-15 | Disposition: A | Discharge status: Home / Self Care | Payer: Medicaid Other | Source: Other Acute Inpatient Hospital | Attending: Gynecologic Oncology | Admitting: Gynecologic Oncology

## 2016-03-15 DIAGNOSIS — R109 Unspecified abdominal pain: Secondary | ICD-10-CM | POA: Insufficient documentation

## 2016-03-15 LAB — BASIC METABOLIC PANEL
ANION GAP: 7 (ref 5–15)
BUN: 6 mg/dL (ref 6–20)
CALCIUM: 7.8 mg/dL — AB (ref 8.9–10.3)
CO2: 27 mmol/L (ref 22–32)
Chloride: 99 mmol/L — ABNORMAL LOW (ref 101–111)
Creatinine, Ser: 0.63 mg/dL (ref 0.44–1.00)
GFR calc Af Amer: >60 mL/min (ref >60)
Glucose, Bld: 105 mg/dL — ABNORMAL HIGH (ref 65–99)
POTASSIUM: 4.1 mmol/L (ref 3.5–5.1)
SODIUM: 133 mmol/L — AB (ref 135–145)

## 2016-03-15 LAB — T4: T4 TOTAL: 5 ug/dL (ref 4.5–12.0)

## 2016-03-15 LAB — CBC
HCT: 34.5 % — ABNORMAL LOW (ref 35.0–47.0)
Hemoglobin: 11.9 g/dL — ABNORMAL LOW (ref 12.0–16.0)
MCH: 29.2 pg (ref 26.0–34.0)
MCHC: 34.6 g/dL (ref 32.0–36.0)
MCV: 84.5 fL (ref 80.0–100.0)
PLATELETS: 421 10*3/uL (ref 150–440)
RBC: 4.08 MIL/uL (ref 3.80–5.20)
RDW: 14.9 % — AB (ref 11.5–14.5)
WBC: 16.6 10*3/uL — AB (ref 3.6–11.0)

## 2016-03-15 LAB — T3: T3, Total: 58 ng/dL — ABNORMAL LOW (ref 71–180)

## 2016-03-15 NOTE — Discharge Summary (Addendum)
[employer] - [place] [facility]   PATIENT NAME: Pamela [name]    MR#:  [id number]  DATE OF BIRTH:  April 23, 1981  DATE OF ADMISSION:  03/05/2016   ADMITTING PHYSICIAN: Wyatt Haste, MD  DATE OF DISCHARGE: 03/15/2016 PRIMARY CARE PHYSICIAN: No PCP Per Patient   ADMISSION DIAGNOSIS:  Sepsis secondary to UTI (HCC) [A41.9, N39.0] DISCHARGE DIAGNOSIS:  Active Problems:   Sepsis (HCC)   Diffuse abdominal pain   Pelvic abscess in female  SECONDARY DIAGNOSIS:   Past Medical History:  Diagnosis Date  . Medical history non-contributory    HOSPITAL COURSE:  35 year old Hispanic female without significant medical history presenting with abdominal pain and fever.  #1. Sepsis due to urinary tract infection and pelvic abscess ,  She has been on meropenem for 10 days, resistant E coli in urinary cultures, tuboovarian abscess or thickening of right side bowel walls on CT of abdomen and pelvis,   Blood cultures are negative so far, MRSA by PCR is negative, urine culture e coli.    Appreciated GYn and Surgery help.   continue IV meropenem per Dr. Sampson Goon, Dr. Orvis Brill and Dr. Bonney Aid. Repeated CT: Multiple abs and pelvic abscesses not improving. S/p drainage of abdominal collection. Leukocytosis is better but still high. repeated abdominal and pelvic US yesterday showed large pelvic abscess.  #2 Acute pyelonephritis (ESBL), on meropenem, follow-up CBC.  #3. Ascites of unclear etiology, possible due to reaction to pelvic abscess. s/p US guided tap- wait for cx result- negative.  #4. Leukocytosis, Follow up CBC.  #5. Metabolic acidosis, improved with intravenous bicarbonate.  #6. Anemia, stable.  #7. Hyperglycemia, improved.  hemoglobin A1c 5.2   Ileus, had BM twice.  Hyponatremia. On  normal saline and follow-up BMP.  Elevated TSH 58.964,Unclear ideology, possible subclinical hypothyroidism due to sepsis.  T3, T4 are unremarkable.  Per Dr.  Bonney Aid, the patient requires more definitive drainage either by a facility who has dedicated IR staffing, or the surgical expertise for surgical drainage.  Given size of the fluid collection, she is at high risk of intraoperative enterotomy and enterocutaneous fistula formation with laparotomy.  he have spoken with Dr. Duard Brady at [facility] who accepted the patient in transfer.  I discussed with Dr. Bonney Aid. DISCHARGE CONDITIONS:  Guarded, the patient will be transferred to [facility]. CONSULTS OBTAINED:  Treatment Team:  Wyatt Haste, MD Vena Austria, MD Lattie Haw, MD Leafy Ro, MD Mick Sell, MD Raj Janus, MD DRUG ALLERGIES:  No Known Allergies DISCHARGE MEDICATIONS:     Medication List    You have not been prescribed any medications.      DISCHARGE INSTRUCTIONS:   DIET:  Nothing by mouth DISCHARGE CONDITION:  Guarded ACTIVITY:  As tolerated DISCHARGE LOCATION:    If you experience worsening of your admission symptoms, develop shortness of breath, life threatening emergency, suicidal or homicidal thoughts you must seek medical attention immediately by calling 911 or calling your MD immediately  if symptoms less severe.  You Must read complete instructions/literature along with all the possible adverse reactions/side effects for all the Medicines you take and that have been prescribed to you. Take any new Medicines after you have completely understood and accpet all the possible adverse reactions/side effects.   Please note  You were cared for by a hospitalist during your hospital stay. If you have any questions about your discharge medications or the care you received while you were in the hospital after  you are discharged, you can call the unit and asked to speak with the hospitalist on call if the hospitalist that took care of you is not available. Once you are discharged, your primary care physician will handle any further medical issues. Please  note that NO REFILLS for any discharge medications will be authorized once you are discharged, as it is imperative that you return to your primary care physician (or establish a relationship with a primary care physician if you do not have one) for your aftercare needs so that they can reassess your need for medications and monitor your lab values.    On the day of Discharge:  VITAL SIGNS:  Blood pressure 113/75, pulse 71, temperature 98.1 F (36.7 C), temperature source Oral, resp. rate 18, height 5\' 2"  (1.575 m), weight 118 lb 2.7 oz (53.6 kg), last menstrual period 03/09/2016, SpO2 95 %. PHYSICAL EXAMINATION:  GENERAL:  35 y.o.-year-old patient lying in the bed with no acute distress.  EYES: Pupils equal, round, reactive to light and accommodation. No scleral icterus. Extraocular muscles intact.  HEENT: Head atraumatic, normocephalic. Oropharynx and nasopharynx clear.  NECK:  Supple, no jugular venous distention. No thyroid enlargement, no tenderness.  LUNGS: Normal breath sounds bilaterally, no wheezing, rales,rhonchi or crepitation. No use of accessory muscles of respiration.  CARDIOVASCULAR: S1, S2 normal. No murmurs, rubs, or gallops.  ABDOMEN: Soft, diffuse tenderness and distension. Bowel sounds present.  EXTREMITIES: No pedal edema, cyanosis, or clubbing.  NEUROLOGIC: Cranial nerves II through XII are intact. Muscle strength 5/5 in all extremities. Sensation intact. Gait not checked.  PSYCHIATRIC: The patient is alert and oriented x 3.  SKIN: No obvious rash, lesion, or ulcer.  DATA REVIEW:   CBC  Recent Labs Lab 03/15/16 0459  WBC 16.6*  HGB 11.9*  HCT 34.5*  PLT 421    Chemistries   Recent Labs Lab 03/14/16 0445 03/15/16 0459  NA 133* 133*  K 4.8 4.1  CL 100* 99*  CO2 23 27  GLUCOSE 71 105*  BUN 7 6  CREATININE 0.70 0.63  CALCIUM 8.0* 7.8*  MG 1.8  --      Microbiology Results  Results for orders placed or performed during the hospital encounter of  03/05/16  Urine culture     Status: Abnormal   Collection Time: 03/05/16  5:10 PM  Result Value Ref Range Status   Specimen Description URINE, CLEAN CATCH  Final   Special Requests NONE  Final   Culture (A)  Final    >=100,000 COLONIES/mL ESCHERICHIA COLI Confirmed Extended Spectrum Beta-Lactamase Producer (ESBL) Performed at [facility]    Report Status 03/07/2016 FINAL  Final   Organism ID, Bacteria ESCHERICHIA COLI (A)  Final      Susceptibility   Escherichia coli - MIC*    AMPICILLIN >=32 RESISTANT Resistant     CEFAZOLIN >=64 RESISTANT Resistant     CEFTRIAXONE >=64 RESISTANT Resistant     CIPROFLOXACIN >=4 RESISTANT Resistant     GENTAMICIN <=1 SENSITIVE Sensitive     IMIPENEM <=0.25 SENSITIVE Sensitive     NITROFURANTOIN <=16 SENSITIVE Sensitive     TRIMETH/SULFA <=20 SENSITIVE Sensitive     AMPICILLIN/SULBACTAM >=32 RESISTANT Resistant     PIP/TAZO >=128 RESISTANT Resistant     Extended ESBL POSITIVE Resistant     * >=100,000 COLONIES/mL ESCHERICHIA COLI  Blood culture (routine x 2)     Status: None   Collection Time: 03/05/16  6:10 PM  Result Value Ref Range Status  Specimen Description BLOOD LEFT ARM  Final   Special Requests   Final    BOTTLES DRAWN AEROBIC AND ANAEROBIC AER 5CC,ANA 4CC   Culture NO GROWTH 5 DAYS  Final   Report Status 03/10/2016 FINAL  Final  Blood culture (routine x 2)     Status: None   Collection Time: 03/05/16  6:20 PM  Result Value Ref Range Status   Specimen Description BLOOD LEFT AC  Final   Special Requests BOTTLES DRAWN AEROBIC AND ANAEROBIC AER 4CC,ANA5CC  Final   Culture NO GROWTH 5 DAYS  Final   Report Status 03/10/2016 FINAL  Final  MRSA PCR Screening     Status: None   Collection Time: 03/05/16  8:05 PM  Result Value Ref Range Status   MRSA by PCR NEGATIVE NEGATIVE Final    Comment:        The GeneXpert MRSA Assay (FDA approved for NASAL specimens only), is one component of a comprehensive MRSA  colonization surveillance program. It is not intended to diagnose MRSA infection nor to guide or monitor treatment for MRSA infections.   Chlamydia/NGC rt PCR ([facility] only)     Status: None   Collection Time: 03/07/16 10:54 AM  Result Value Ref Range Status   Specimen source GC/Chlam URINE, RANDOM  Final   Chlamydia Tr NOT DETECTED NOT DETECTED Final   N gonorrhoeae NOT DETECTED NOT DETECTED Final    Comment: (NOTE) 100  This methodology has not been evaluated in pregnant women or in 200  patients with a history of hysterectomy. 300 400  This methodology will not be performed on patients less than 61  years of age.   Body fluid culture     Status: None   Collection Time: 03/07/16 12:03 PM  Result Value Ref Range Status   Specimen Description PERITONEAL  Final   Special Requests NONE  Final   Gram Stain   Final    ABUNDANT WBC PRESENT,BOTH PMN AND MONONUCLEAR NO ORGANISMS SEEN    Culture   Final    No growth aerobically or anaerobically. Performed at [facility]    Report Status 03/11/2016 FINAL  Final  Aerobic/Anaerobic Culture (surgical/deep wound)     Status: None (Preliminary result)   Collection Time: 03/11/16  4:30 PM  Result Value Ref Range Status   Specimen Description ABSCESS  Final   Special Requests RIGHT LATERAL ABD  Final   Gram Stain   Final    RARE WBC PRESENT, PREDOMINANTLY MONONUCLEAR NO ORGANISMS SEEN    Culture   Final    NO GROWTH 4 DAYS NO ANAEROBES ISOLATED; CULTURE IN PROGRESS FOR 5 DAYS Performed at [facility]    Report Status PENDING  Incomplete  Aerobic/Anaerobic Culture (surgical/deep wound)     Status: None (Preliminary result)   Collection Time: 03/11/16  4:37 PM  Result Value Ref Range Status   Specimen Description ABSCESS  Final   Special Requests RIGHT INFERIOR ABD  Final   Gram Stain   Final    ABUNDANT WBC PRESENT, PREDOMINANTLY PMN NO ORGANISMS SEEN Performed at [facility]    Culture   Final    FEW  ESCHERICHIA COLI NO ANAEROBES ISOLATED; CULTURE IN PROGRESS FOR 5 DAYS    Report Status PENDING  Incomplete   Organism ID, Bacteria ESCHERICHIA COLI  Final      Susceptibility   Escherichia coli - MIC*    AMPICILLIN <=2 SENSITIVE Sensitive     CEFAZOLIN <=4 SENSITIVE  Sensitive     CEFEPIME <=1 SENSITIVE Sensitive     CEFTAZIDIME <=1 SENSITIVE Sensitive     CEFTRIAXONE <=1 SENSITIVE Sensitive     CIPROFLOXACIN 0.5 SENSITIVE Sensitive     GENTAMICIN <=1 SENSITIVE Sensitive     IMIPENEM <=0.25 SENSITIVE Sensitive     TRIMETH/SULFA <=20 SENSITIVE Sensitive     AMPICILLIN/SULBACTAM <=2 SENSITIVE Sensitive     PIP/TAZO <=4 SENSITIVE Sensitive     Extended ESBL NEGATIVE Sensitive     * FEW ESCHERICHIA COLI    RADIOLOGY:  [place] Transvaginal Non-ob  Result Date: 03/14/2016 CLINICAL DATA:  Pelvic abscess. EXAM: TRANSABDOMINAL AND TRANSVAGINAL ULTRASOUND OF PELVIS TECHNIQUE: Both transabdominal and transvaginal ultrasound examinations of the pelvis were performed. Transabdominal technique was performed for global imaging of the pelvis including uterus, ovaries, adnexal regions, and pelvic cul-de-sac. It was necessary to proceed with endovaginal exam following the transabdominal exam to visualize the uterus and ovaries. COMPARISON:  CT 03/11/2016. FINDINGS: Uterus Measurements: 10.6 x 2.7 x 5.2 cm. No fibroids or other mass visualized. Endometrium Thickness: 3.8 mm.  No focal abnormality visualized. Right ovary Measurements: 3.8 x 2.5 x 3.8 cm. 3.2 x 1.5 x 1.4 cm simple appearing cyst in the right ovary. Left ovary Measurements: 2.3 x 1.3 x 2.1 cm. Normal appearance/no adnexal mass. Other findings Ill-defined heterogeneous complex masses are noted in the pelvis, the largest on the right measures 7.5 cm in maximum diameter, the largest on the left/ cul de sac measures 7.0 cm in maximum diameter. These could represent previously identified pelvic abscesses . IMPRESSION: 1. Large complex masses are noted in  pelvis as described above. These could represent previously identified pelvic abscesses. These can be followed with serial CTs. 2.  3.2 cm simple appearing cyst right ovary. Electronically Signed   By: Maisie Fushomas  Register   On: 03/14/2016 17:22   [place] Pelvis Complete  Result Date: 03/14/2016 CLINICAL DATA:  Pelvic abscess. EXAM: TRANSABDOMINAL AND TRANSVAGINAL ULTRASOUND OF PELVIS TECHNIQUE: Both transabdominal and transvaginal ultrasound examinations of the pelvis were performed. Transabdominal technique was performed for global imaging of the pelvis including uterus, ovaries, adnexal regions, and pelvic cul-de-sac. It was necessary to proceed with endovaginal exam following the transabdominal exam to visualize the uterus and ovaries. COMPARISON:  CT 03/11/2016. FINDINGS: Uterus Measurements: 10.6 x 2.7 x 5.2 cm. No fibroids or other mass visualized. Endometrium Thickness: 3.8 mm.  No focal abnormality visualized. Right ovary Measurements: 3.8 x 2.5 x 3.8 cm. 3.2 x 1.5 x 1.4 cm simple appearing cyst in the right ovary. Left ovary Measurements: 2.3 x 1.3 x 2.1 cm. Normal appearance/no adnexal mass. Other findings Ill-defined heterogeneous complex masses are noted in the pelvis, the largest on the right measures 7.5 cm in maximum diameter, the largest on the left/ cul de sac measures 7.0 cm in maximum diameter. These could represent previously identified pelvic abscesses . IMPRESSION: 1. Large complex masses are noted in pelvis as described above. These could represent previously identified pelvic abscesses. These can be followed with serial CTs. 2.  3.2 cm simple appearing cyst right ovary. Electronically Signed   By: Maisie Fushomas  Register   On: 03/14/2016 17:22     Management plans discussed with the patient, family and they are in agreement.  CODE STATUS:     Code Status Orders        Start     Ordered   03/05/16 1838  Full code  Continuous     03/05/16 1838    Code Status  History    Date Active Date  Inactive Code Status Order ID Comments User Context   This patient has a current code status but no historical code status.      TOTAL TIME TAKING CARE OF THIS PATIENT: 43 minutes.    Shaune Pollack M.D on 03/15/2016 at 1:00 PM  Between 7am to 6pm - Pager - [phone]  After 6pm go to [email or web address] - Scientist, research (life sciences) [facility]  Office  [phone]  CC: Primary care physician; No PCP Per Patient   Note: This dictation was prepared with Dragon dictation along with smaller phrase technology. Any transcriptional errors that result from this process are unintentional.

## 2016-03-15 NOTE — Progress Notes (Signed)
Interpreter in room. Patient stated that she had pain and voiced concern about being able to eat. PRN pain medication given and K-pad ordered to help with back discomfort. Attending MD present in room, concerns addressed. Attending physician notified consulting OBGYN physician about patient. Awaiting consulting physician to determine additional intervention. Continue to assess.

## 2016-03-15 NOTE — Discharge Summary (Signed)
Physician Discharge Summary  Patient ID: Pamela [name] MRN: [id number] DOB/AGE: Oct 24, 1980 35 y.o.  Admit date: 03/05/2016 Discharge date: 03/15/2016  Admission Diagnoses: Pylonephritis  Discharge Diagnoses:  Active Problems:   Sepsis ([place])   Diffuse abdominal pain   Pelvic abscess in female   Discharged Condition: stable  Hospital Course: Patient was admitted to the medicine service on 03/05/2016 with presumptive pyelonephritis and meeting SIRS criteria.  She was initially monitored in the ICU in stepdown status, did not require pressors.  Initial coverage was with ceftriaxone, when the patient failed to improve CT abdomen and pelvis was obtained on 03/05/2016 showing multiple abscess localized around the right lower quadrant but extending up to the mesentery.  Read was consistent with TOA, although patient is status post prior BTL.  Coverage was broadened by addition of doxycycline, this was discontinued after GC/CT cultures returned negative on 03/06/16.  Urine culture eventual grew out ESBL E. Coli and the patient was switched to merpenem on evening of 03/05/16.  She remained on meropenem with some improvement in WBC and remained afebrile but continued to have significant abdominal pain.  She underwent US guided paracentesis on 03/07/2016 removing 64m of fluid but did not have a drain placed.  She did have a mild ileus at the time hindering access to the fluid collection secondary to distention of small bowl.  CT image guided drainage on 03/11/2016 was successful in placing a pig tail catheter into on the fluid collections.  Repeat imaging on 03/14/2016 did not show any improvement in size of the fluid collection with continued symptoms.  She was also noted to have an elevated TSH, T3 and T4 on 03/14/2016 unclear etiology.  Transfer initiated 03/15/16 because of persistence of fluid collection and need for more definitive drainage ideally by IR but possibly via laparotomy.    Consults: ID,  general surgery and gynecology  Significant Diagnostic Studies:  Results for orders placed or performed during the hospital encounter of 03/05/16 (from the past 72 hour(s))  CBC     Status: Abnormal   Collection Time: 03/13/16  4:51 AM  Result Value Ref Range   WBC 14.8 (H) 3.6 - 11.0 K/uL   RBC 3.95 3.80 - 5.20 MIL/uL   Hemoglobin 11.5 (L) 12.0 - 16.0 g/dL   HCT 33.3 (L) 35.0 - 47.0 %   MCV 84.5 80.0 - 100.0 fL   MCH 29.2 26.0 - 34.0 pg   MCHC 34.6 32.0 - 36.0 g/dL   RDW 14.5 11.5 - 14.5 %   Platelets 434 150 - 440 K/uL  CBC     Status: Abnormal   Collection Time: 03/14/16  4:45 AM  Result Value Ref Range   WBC 17.1 (H) 3.6 - 11.0 K/uL   RBC 4.13 3.80 - 5.20 MIL/uL   Hemoglobin 12.2 12.0 - 16.0 g/dL   HCT 35.0 35.0 - 47.0 %   MCV 84.9 80.0 - 100.0 fL   MCH 29.5 26.0 - 34.0 pg   MCHC 34.8 32.0 - 36.0 g/dL   RDW 14.5 11.5 - 14.5 %   Platelets 427 150 - 440 K/uL  Basic metabolic panel     Status: Abnormal   Collection Time: 03/14/16  4:45 AM  Result Value Ref Range   Sodium 133 (L) 135 - 145 mmol/L   Potassium 4.8 3.5 - 5.1 mmol/L   Chloride 100 (L) 101 - 111 mmol/L   CO2 23 22 - 32 mmol/L   Glucose, Bld 71 65 - 99  mg/dL   BUN 7 6 - 20 mg/dL   Creatinine, Ser 0.70 0.44 - 1.00 mg/dL   Calcium 8.0 (L) 8.9 - 10.3 mg/dL   GFR calc non Af Amer >60 >60 mL/min   GFR calc Af Amer >60 >60 mL/min    Comment: (NOTE) The eGFR has been calculated using the CKD EPI equation. This calculation has not been validated in all clinical situations. eGFR's persistently <60 mL/min signify possible Chronic Kidney Disease.    Anion gap 10 5 - 15  Magnesium     Status: None   Collection Time: 03/14/16  4:45 AM  Result Value Ref Range   Magnesium 1.8 1.7 - 2.4 mg/dL  Phosphorus     Status: None   Collection Time: 03/14/16  4:45 AM  Result Value Ref Range   Phosphorus 3.2 2.5 - 4.6 mg/dL  TSH     Status: Abnormal   Collection Time: 03/14/16  4:45 AM  Result Value Ref Range   TSH 58.964  (H) 0.350 - 4.500 uIU/mL  T4     Status: None   Collection Time: 03/14/16  4:45 AM  Result Value Ref Range   T4, Total 5.0 4.5 - 12.0 ug/dL    Comment: (NOTE) Performed At: [facility] [place], [place] [id number] Lindon Romp MD JT:[id number]   T3     Status: Abnormal   Collection Time: 03/14/16  4:45 AM  Result Value Ref Range   T3, Total 58 (L) 71 - 180 ng/dL    Comment: (NOTE) Performed At: [facility] [place], [place] [id number] Lindon Romp MD [id number]   CBC     Status: Abnormal   Collection Time: 03/15/16  4:59 AM  Result Value Ref Range   WBC 16.6 (H) 3.6 - 11.0 K/uL   RBC 4.08 3.80 - 5.20 MIL/uL   Hemoglobin 11.9 (L) 12.0 - 16.0 g/dL   HCT 34.5 (L) 35.0 - 47.0 %   MCV 84.5 80.0 - 100.0 fL   MCH 29.2 26.0 - 34.0 pg   MCHC 34.6 32.0 - 36.0 g/dL   RDW 14.9 (H) 11.5 - 14.5 %   Platelets 421 150 - 440 K/uL  Basic metabolic panel     Status: Abnormal   Collection Time: 03/15/16  4:59 AM  Result Value Ref Range   Sodium 133 (L) 135 - 145 mmol/L   Potassium 4.1 3.5 - 5.1 mmol/L   Chloride 99 (L) 101 - 111 mmol/L   CO2 27 22 - 32 mmol/L   Glucose, Bld 105 (H) 65 - 99 mg/dL   BUN 6 6 - 20 mg/dL   Creatinine, Ser 0.63 0.44 - 1.00 mg/dL   Calcium 7.8 (L) 8.9 - 10.3 mg/dL   GFR calc non Af Amer >60 >60 mL/min   GFR calc Af Amer >60 >60 mL/min    Comment: (NOTE) The eGFR has been calculated using the CKD EPI equation. This calculation has not been validated in all clinical situations. eGFR's persistently <60 mL/min signify possible Chronic Kidney Disease.    Anion gap 7 5 - 15   Ct Abdomen Pelvis W Contrast  Result Date: 03/06/2016 CLINICAL DATA:  Dysuria, abdominal pain, fever and suprapubic pain for approximately 10 days. EXAM: CT ABDOMEN AND PELVIS WITH CONTRAST TECHNIQUE: Multidetector CT imaging of the abdomen and pelvis was performed using the standard protocol following bolus administration of  intravenous contrast. CONTRAST:  100 ml ISOVUE-300 IOPAMIDOL (ISOVUE-300) INJECTION 61% COMPARISON:  None. FINDINGS: There small bilateral pleural effusions. Associated compressive atelectasis in the lung bases is identified. There is a moderate volume of free abdominal fluid. Multiple fluid collections are identified in the pelvis. A collection just superior to the right adnexa in the pelvis measures approximately 6.0 cm AP by 3.1 cm transverse by 2.6 cm craniocaudal. A collection anterior to the rectum which extends cephalad into the left pelvis measures approximately 4.5 cm transverse by 2.1 cm AP by 6.3 cm craniocaudal. A septated collection centered in the right adnexa measures 5.2 cm AP x 4.5 cm transverse by approximately 3.4 cm craniocaudal. A third collection in the right lower quadrant measures 3.6 cm transverse by 2.4 cm AP by 2.5 cm craniocaudal. These collections may communicate. There is marked inflammatory change in mesenteric and omental fat in the pelvis. Multiple small bowel loops demonstrate wall thickening and hyperenhancement. Additionally, the walls of the ascending and proximal transverse colon are hyperenhancing and thickened as on the walls of the sigmoid colon. The appendix is hyperenhancing on image 67. The uterus appears normal. No free intraperitoneal air, pneumatosis or portal venous gas is seen. The kidneys demonstrate homogeneous and normal enhancement. No hydronephrosis is present. The urinary bladder is unremarkable. The gallbladder, liver, spleen pancreas, biliary tree and adrenal glands appear normal. No bony abnormality is identified. IMPRESSION: Constellation of findings most suggestive of pelvic inflammatory disease with tubo-ovarian abscess on the right and extensive abscess formation the abdomen and pelvis. Abnormal appearance of pelvic small bowel loops in the ascending and proximal transverse colon likely reflects secondary inflammation. Missed appendicitis is possible but  thought less likely. Normal appearing kidneys.  No CT evidence of pyelonephritis. Small bilateral pleural effusions and compressive basilar atelectasis. Electronically Signed   By: Inge Rise M.D.   On: 03/06/2016 18:25   US Renal  Result Date: 03/06/2016 CLINICAL DATA:  Acute pyelonephritis. EXAM: RENAL / URINARY TRACT ULTRASOUND COMPLETE COMPARISON:  None. FINDINGS: Right Kidney: Length: 10.3 cm. Echogenicity within normal limits. No mass or hydronephrosis visualized. Left Kidney: Length: 9.2 cm. Echogenicity within normal limits. No mass or hydronephrosis visualized. Bladder: Appears normal for degree of bladder distention. Bilateral ureteral jets are noted. Small amount of ascites is noted around the liver and spleen. IMPRESSION: Small amount of ascites is noted. Kidneys appear grossly normal without hydronephrosis or renal obstruction. Electronically Signed   By: Marijo Conception, M.D.   On: 03/06/2016 12:07   Treatments: IV hydration, antibiotics: Meropenem and procedures: percutaneous drainage catheter placement  Discharge Exam: Blood pressure 121/74, pulse 75, temperature 98.2 F (36.8 C), temperature source Oral, resp. rate 19, height _0  (1.575 m), weight 53.6 kg (118 lb 2.7 oz), last menstrual period 03/09/2016, SpO2 96 %. General appearance: alert, appears stated age and no distress Head: Normocephalic, without obvious abnormality, atraumatic Resp: clear to auscultation bilaterally GI: abnormal findings:  hypactive BS, soft, diffusely tender BLQ R>L Extremities: extremities normal, atraumatic, no cyanosis or edema  Disposition:  Transfer to [facility] gynecology   Current Facility-Administered Medications:  .  0.9 % NaCl with KCl 20 mEq/ L  infusion, , Intravenous, Continuous, Demetrios Loll, MD, Last Rate: 100 mL/hr at 03/15/16 1016 .  acetaminophen (TYLENOL) tablet 650 mg, 650 mg, Oral, Q6H PRN **OR** acetaminophen (TYLENOL) suppository 650 mg, 650 mg, Rectal, Q6H PRN, Lytle Butte,  MD .  enoxaparin (LOVENOX) injection 40 mg, 40 mg, Subcutaneous, Q24H, Demetrios Loll, MD, 40 mg at 03/14/16 2256 .  feeding supplement (ENSURE ENLIVE) (ENSURE ENLIVE) liquid  237 mL, 237 mL, Oral, BID BM, Vaughan Basta, MD, Stopped at 03/11/16 [phone] .  levothyroxine (SYNTHROID, LEVOTHROID) tablet 100 mcg, 100 mcg, Oral, QAC breakfast, Demetrios Loll, MD, 100 mcg at 03/15/16 0636 .  meropenem (MERREM) 1 g in sodium chloride 0.9 % 100 mL IVPB, 1 g, Intravenous, Q8H, Theodoro Grist, MD, 1 g at 03/15/16 1233 .  morphine 2 MG/ML injection 2 mg, 2 mg, Intravenous, Q4H PRN, Lytle Butte, MD, 2 mg at 03/15/16 1229 .  oxyCODONE (Oxy IR/ROXICODONE) immediate release tablet 5 mg, 5 mg, Oral, Q4H PRN, Lytle Butte, MD, 5 mg at 03/15/16 1016 .  promethazine (PHENERGAN) injection 25 mg, 25 mg, Intravenous, Q6H PRN, Vaughan Basta, MD .  sodium chloride flush (NS) 0.9 % injection 3 mL, 3 mL, Intravenous, Q12H, Lytle Butte, MD, 3 mL at 03/14/16 2255 .  sodium chloride flush (NS) 0.9 % injection 5 mL, 5 mL, Intracatheter, TID, Demetrios Loll, MD, 5 mL at 03/14/16 2304 Signed: Dorthula Nettles 03/15/2016, 1:13 PM

## 2016-03-15 NOTE — Progress Notes (Signed)
Obstetric and Gynecology  Subjective  Feels about the same.  States has period where she feels better (after narcotic administration), then periods where she once again suffers from significant abdominal pain.  No real appetite, still having some intermittent nausea.  No fevers, or chills  Objective  Blood pressure 113/75, pulse 71, temperature 98.1 F (36.7 C), temperature source Oral, resp. rate 18, height 5\' 2"  (1.575 m), weight 53.6 kg (118 lb 2.7 oz), last menstrual period 03/09/2016, SpO2 95 %.   Intake/Output Summary (Last 24 hours) at 03/15/16 1120 Last data filed at 03/15/16 0436  Gross per 24 hour  Intake          2056.99 ml  Output             1930 ml  Net           126.99 ml    General: NAD Pulmonary: no increased work of breathing Abdomen: hypoactive, soft, still significant tenderness BLQ R>L, Draing site D/C/I Extremities: no edema  Labs: Results for orders placed or performed during the hospital encounter of 03/05/16 (from the past 24 hour(s))  CBC     Status: Abnormal   Collection Time: 03/15/16  4:59 AM  Result Value Ref Range   WBC 16.6 (H) 3.6 - 11.0 K/uL   RBC 4.08 3.80 - 5.20 MIL/uL   Hemoglobin 11.9 (L) 12.0 - 16.0 g/dL   HCT 09.8 (L) 11.9 - 14.7 %   MCV 84.5 80.0 - 100.0 fL   MCH 29.2 26.0 - 34.0 pg   MCHC 34.6 32.0 - 36.0 g/dL   RDW 82.9 (H) 56.2 - 13.0 %   Platelets 421 150 - 440 K/uL  Basic metabolic panel     Status: Abnormal   Collection Time: 03/15/16  4:59 AM  Result Value Ref Range   Sodium 133 (L) 135 - 145 mmol/L   Potassium 4.1 3.5 - 5.1 mmol/L   Chloride 99 (L) 101 - 111 mmol/L   CO2 27 22 - 32 mmol/L   Glucose, Bld 105 (H) 65 - 99 mg/dL   BUN 6 6 - 20 mg/dL   Creatinine, Ser 8.65 0.44 - 1.00 mg/dL   Calcium 7.8 (L) 8.9 - 10.3 mg/dL   GFR calc non Af Amer >60 >60 mL/min   GFR calc Af Amer >60 >60 mL/min   Anion gap 7 5 - 15    Cultures: Results for orders placed or performed during the hospital encounter of 03/05/16  Urine  culture     Status: Abnormal   Collection Time: 03/05/16  5:10 PM  Result Value Ref Range Status   Specimen Description URINE, CLEAN CATCH  Final   Special Requests NONE  Final   Culture (A)  Final    >=100,000 COLONIES/mL ESCHERICHIA COLI Confirmed Extended Spectrum Beta-Lactamase Producer (ESBL) Performed at [facility]    Report Status 03/07/2016 FINAL  Final   Organism ID, Bacteria ESCHERICHIA COLI (A)  Final      Susceptibility   Escherichia coli - MIC*    AMPICILLIN >=32 RESISTANT Resistant     CEFAZOLIN >=64 RESISTANT Resistant     CEFTRIAXONE >=64 RESISTANT Resistant     CIPROFLOXACIN >=4 RESISTANT Resistant     GENTAMICIN <=1 SENSITIVE Sensitive     IMIPENEM <=0.25 SENSITIVE Sensitive     NITROFURANTOIN <=16 SENSITIVE Sensitive     TRIMETH/SULFA <=20 SENSITIVE Sensitive     AMPICILLIN/SULBACTAM >=32 RESISTANT Resistant     PIP/TAZO >=128 RESISTANT Resistant  Extended ESBL POSITIVE Resistant     * >=100,000 COLONIES/mL ESCHERICHIA COLI  Blood culture (routine x 2)     Status: None   Collection Time: 03/05/16  6:10 PM  Result Value Ref Range Status   Specimen Description BLOOD LEFT ARM  Final   Special Requests   Final    BOTTLES DRAWN AEROBIC AND ANAEROBIC AER 5CC,ANA 4CC   Culture NO GROWTH 5 DAYS  Final   Report Status 03/10/2016 FINAL  Final  Blood culture (routine x 2)     Status: None   Collection Time: 03/05/16  6:20 PM  Result Value Ref Range Status   Specimen Description BLOOD LEFT AC  Final   Special Requests BOTTLES DRAWN AEROBIC AND ANAEROBIC AER 4CC,ANA5CC  Final   Culture NO GROWTH 5 DAYS  Final   Report Status 03/10/2016 FINAL  Final  MRSA PCR Screening     Status: None   Collection Time: 03/05/16  8:05 PM  Result Value Ref Range Status   MRSA by PCR NEGATIVE NEGATIVE Final    Comment:        The GeneXpert MRSA Assay (FDA approved for NASAL specimens only), is one component of a comprehensive MRSA colonization surveillance program. It  is not intended to diagnose MRSA infection nor to guide or monitor treatment for MRSA infections.   Chlamydia/NGC rt PCR ([facility] only)     Status: None   Collection Time: 03/07/16 10:54 AM  Result Value Ref Range Status   Specimen source GC/Chlam URINE, RANDOM  Final   Chlamydia Tr NOT DETECTED NOT DETECTED Final   N gonorrhoeae NOT DETECTED NOT DETECTED Final    Comment: (NOTE) 100  This methodology has not been evaluated in pregnant women or in 200  patients with a history of hysterectomy. 300 400  This methodology will not be performed on patients less than 54  years of age.   Body fluid culture     Status: None   Collection Time: 03/07/16 12:03 PM  Result Value Ref Range Status   Specimen Description PERITONEAL  Final   Special Requests NONE  Final   Gram Stain   Final    ABUNDANT WBC PRESENT,BOTH PMN AND MONONUCLEAR NO ORGANISMS SEEN    Culture   Final    No growth aerobically or anaerobically. Performed at [facility]    Report Status 03/11/2016 FINAL  Final  Aerobic/Anaerobic Culture (surgical/deep wound)     Status: None (Preliminary result)   Collection Time: 03/11/16  4:30 PM  Result Value Ref Range Status   Specimen Description ABSCESS  Final   Special Requests RIGHT LATERAL ABD  Final   Gram Stain   Final    RARE WBC PRESENT, PREDOMINANTLY MONONUCLEAR NO ORGANISMS SEEN    Culture   Final    NO GROWTH 4 DAYS NO ANAEROBES ISOLATED; CULTURE IN PROGRESS FOR 5 DAYS Performed at [facility]    Report Status PENDING  Incomplete  Aerobic/Anaerobic Culture (surgical/deep wound)     Status: None (Preliminary result)   Collection Time: 03/11/16  4:37 PM  Result Value Ref Range Status   Specimen Description ABSCESS  Final   Special Requests RIGHT INFERIOR ABD  Final   Gram Stain   Final    ABUNDANT WBC PRESENT, PREDOMINANTLY PMN NO ORGANISMS SEEN Performed at [facility]    Culture   Final    FEW ESCHERICHIA COLI NO ANAEROBES ISOLATED;  CULTURE IN PROGRESS FOR 5 DAYS  Report Status PENDING  Incomplete   Organism ID, Bacteria ESCHERICHIA COLI  Final      Susceptibility   Escherichia coli - MIC*    AMPICILLIN <=2 SENSITIVE Sensitive     CEFAZOLIN <=4 SENSITIVE Sensitive     CEFEPIME <=1 SENSITIVE Sensitive     CEFTAZIDIME <=1 SENSITIVE Sensitive     CEFTRIAXONE <=1 SENSITIVE Sensitive     CIPROFLOXACIN 0.5 SENSITIVE Sensitive     GENTAMICIN <=1 SENSITIVE Sensitive     IMIPENEM <=0.25 SENSITIVE Sensitive     TRIMETH/SULFA <=20 SENSITIVE Sensitive     AMPICILLIN/SULBACTAM <=2 SENSITIVE Sensitive     PIP/TAZO <=4 SENSITIVE Sensitive     Extended ESBL NEGATIVE Sensitive     * FEW ESCHERICHIA COLI    Imaging:  Assessment   35 y.o.  [id number] admitted with pylonephritis secondary to ESBL E.coli, and on imaging noted to have several complex right sided fluid collections read as TOA  Plan   1) TOA/abdominal abscess - given that the patient has not improved after a week of meripenem, and the fact that the collections have if anything increased in size since the IR drain placement, I've discussed the case with Dr Orvis BrillLoflin and Dr. Imogene Burnhen.  The patient requires more definitive drainage either by a facility who has dedicated IR staffing, or the surgical expertise for surgical drainage.  Given size of the fluid collection, she is at high risk of intraoperative enterotomy and enterocutaneous fistula formation with laparotomy.  [facility] - [facility]

## 2016-03-15 NOTE — Progress Notes (Signed)
Patient visitors requested to speak with supervisor as to why patient care has been prolonged and when [facility].  Primary RN let visitors know that the order for discharge is in place, and we are waiting a bed. Supervisor made aware. Handoff report given to next shift RN

## 2016-03-16 LAB — AEROBIC/ANAEROBIC CULTURE W GRAM STAIN (SURGICAL/DEEP WOUND): Culture: NO GROWTH

## 2016-03-16 LAB — AEROBIC/ANAEROBIC CULTURE (SURGICAL/DEEP WOUND)

## 2016-03-16 NOTE — Progress Notes (Signed)
Called [facility] transfer center at [phone] to give them an ETA.  [facility].  Spanish speaking interpreter is present.

## 2016-03-16 NOTE — Progress Notes (Signed)
Report given via telephone to [facility]. 

## 2016-03-16 NOTE — Progress Notes (Signed)
Arranged for [facility] to pick up patient and [facility]  [facility] one [facility] and will not be able to transport patient until morning- change of shift.  Called [facility] transfer center to see if they could pick up patient but they would not be able to pick up patient until sometime in afternoon.  Patient will wait for [facility].  Lennon AlstromClay, Redina Zeller N  03/16/2016 2:22 AM

## 2016-03-18 LAB — AEROBIC/ANAEROBIC CULTURE (SURGICAL/DEEP WOUND)

## 2016-03-18 LAB — AEROBIC/ANAEROBIC CULTURE W GRAM STAIN (SURGICAL/DEEP WOUND)

## 2018-02-06 IMAGING — CT CT IMAGE GUIDED DRAINAGE BY PERCUTANEOUS CATHETER
1 of 3 series · 14 of 32 positions shown, 19 images · non-contrast
Comparison: none

INDICATION: Pelvic abscess.  Right adnexal cysts.
TECHNIQUE: Informed written consent was obtained from the patient after a
thorough discussion of the procedural risks, benefits and
alternatives. All questions were addressed. Maximal Sterile Barrier
Technique was utilized including caps, mask, sterile gowns, sterile
gloves, sterile drape, hand hygiene and skin antiseptic. A timeout
was performed prior to the initiation of the procedure.

[Series 2: axial st · axial · 0.82mm/px · z∈[+946,+1165]mm · 14 of 83 slices shown, 19 images]
[im 5/83  soft-tissue]
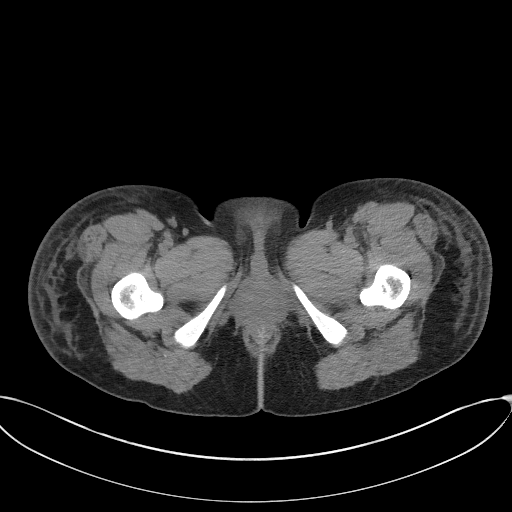
[im 5/83  bone]
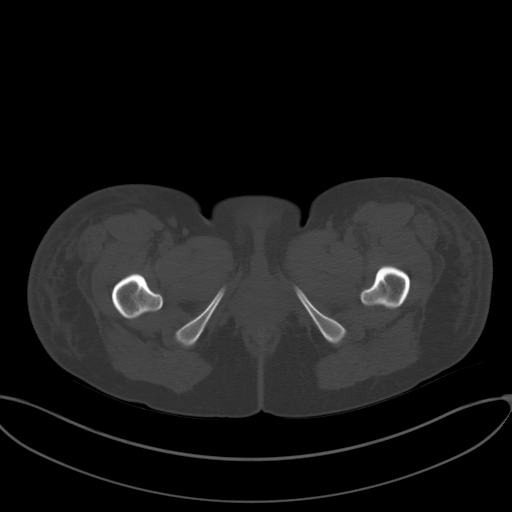
[im 13/83  soft-tissue]
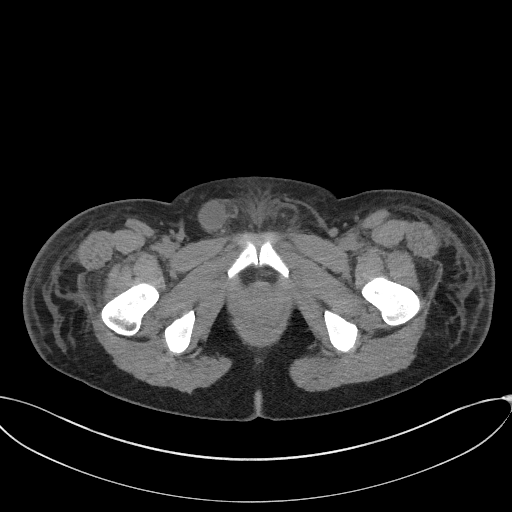
[im 18/83  soft-tissue]
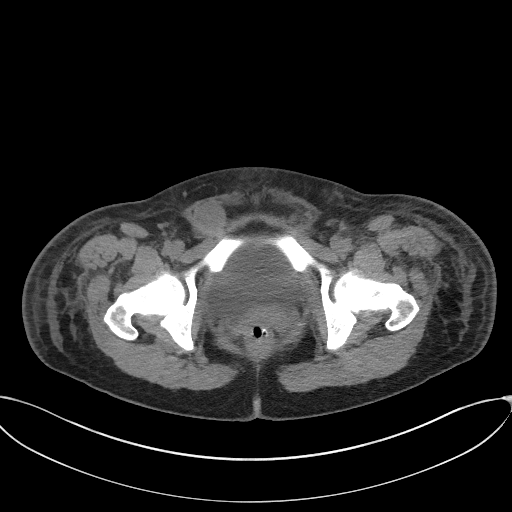
[im 22/83  soft-tissue]
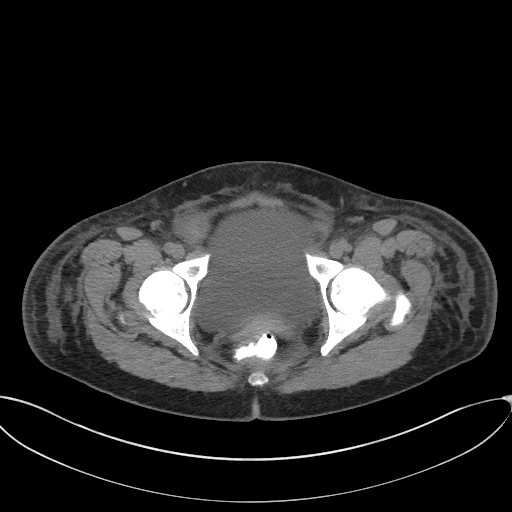
[im 31/83  soft-tissue]
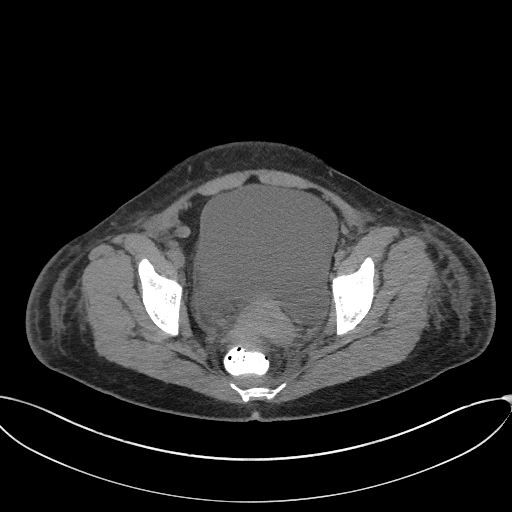
[im 35/83  soft-tissue]
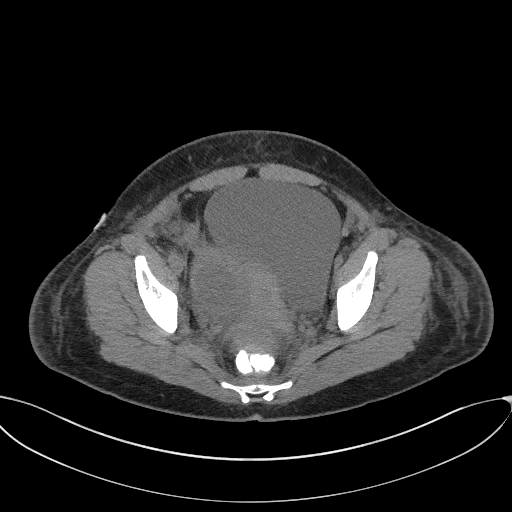
[im 44/83  soft-tissue]
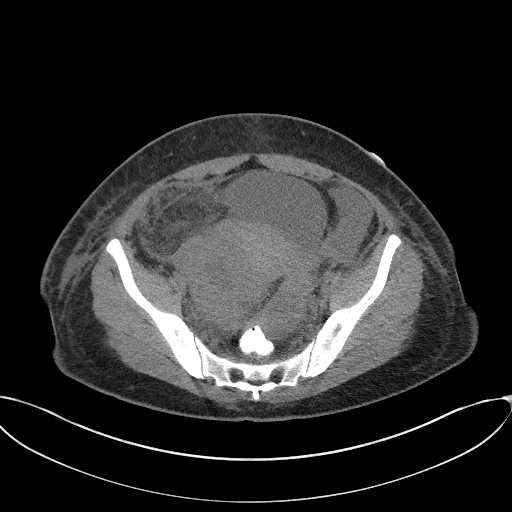
[im 48/83  soft-tissue]
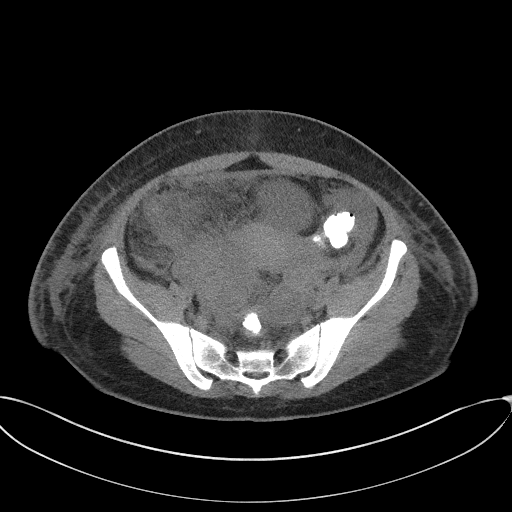
[im 52/83  soft-tissue]
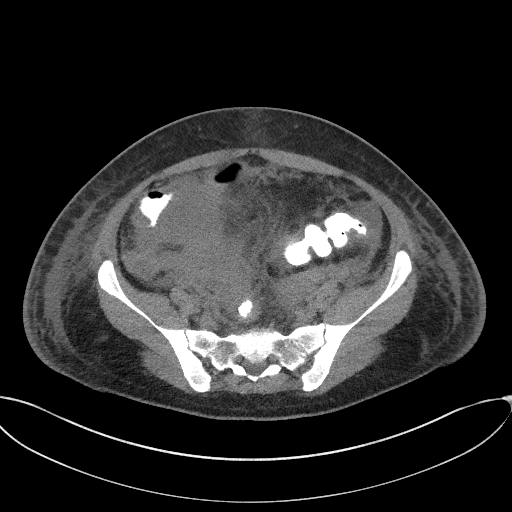
[im 52/83  bone]
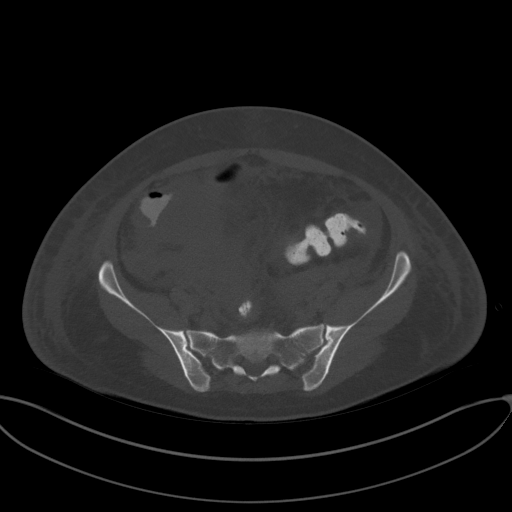
[im 61/83  soft-tissue]
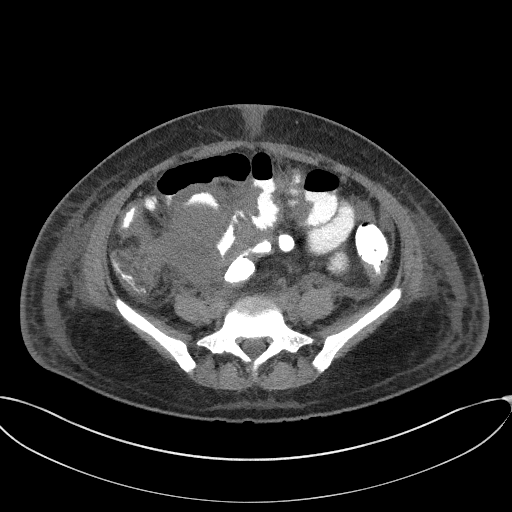
[im 65/83  soft-tissue]
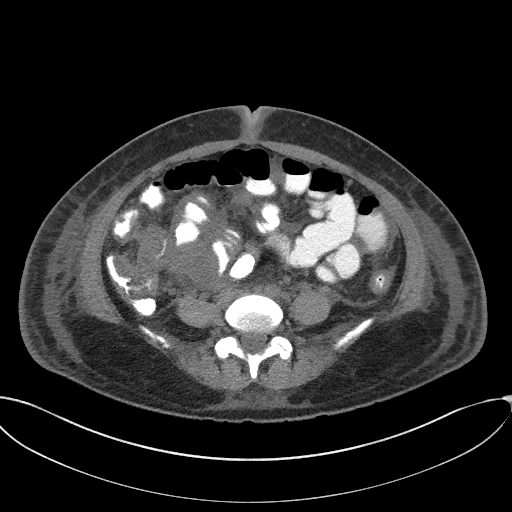
[im 65/83  lung]
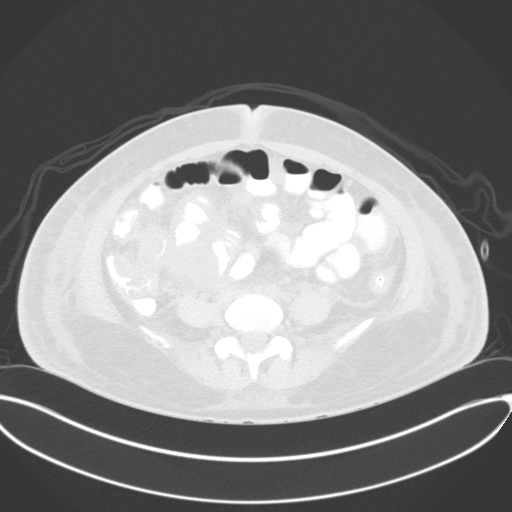
[im 70/83  soft-tissue]
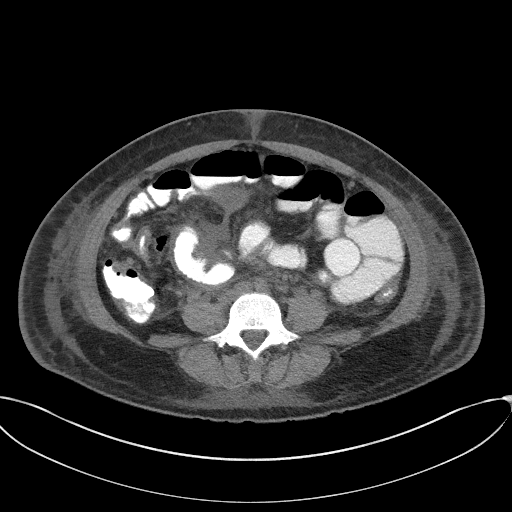
[im 70/83  lung]
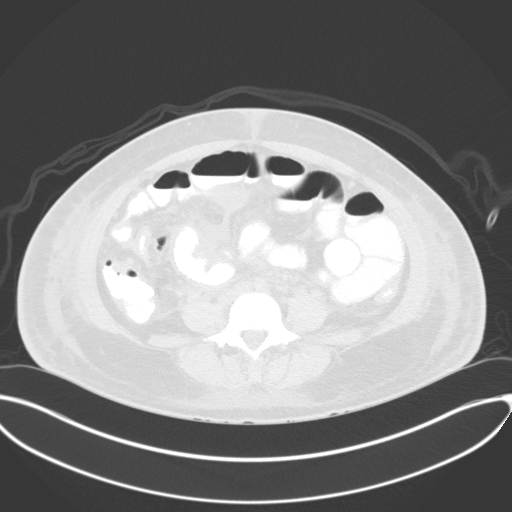
[im 74/83  lung]
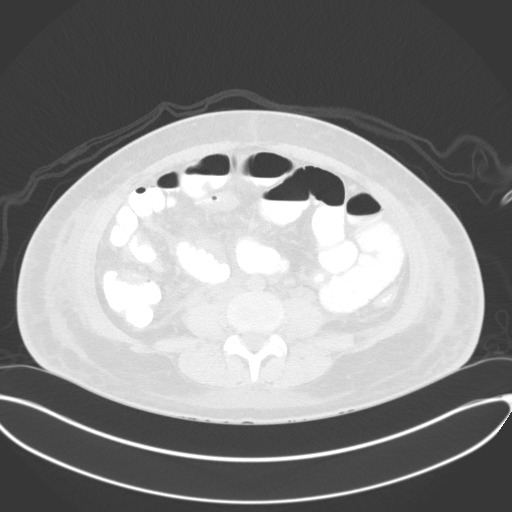
[im 78/83  soft-tissue]
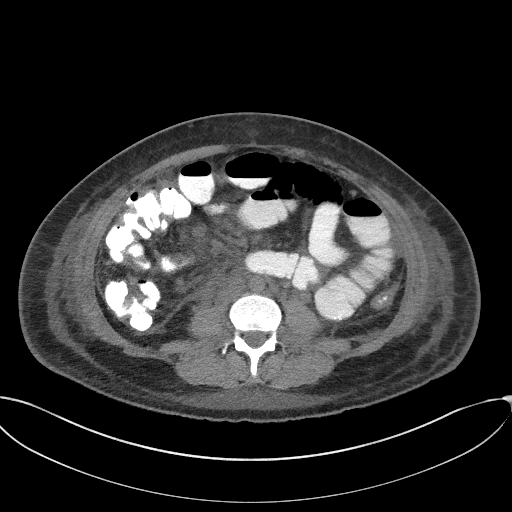
[im 78/83  lung]
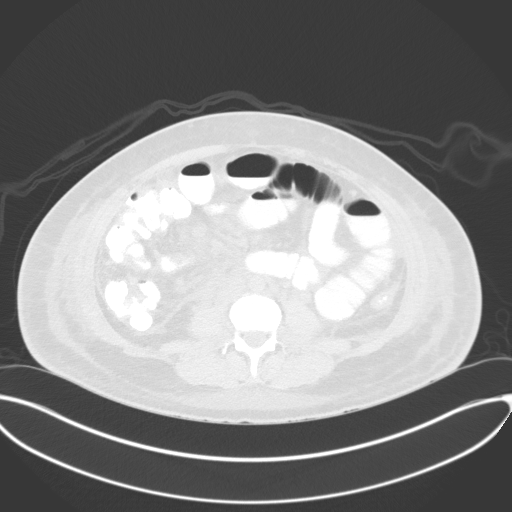

[14 of 32 positions shown; findings below may reference images not displayed]

EXAM:
CT GUIDED DRAINAGE OF PELVIC ABSCESS. CT-guided aspiration of a
right adnexal cyst.

MEDICATIONS:
The patient is currently admitted to the hospital and receiving
intravenous antibiotics. The antibiotics were administered within an
appropriate time frame prior to the initiation of the procedure.

ANESTHESIA/SEDATION:
Three mg IV Versed 150 mcg IV Fentanyl

Moderate Sedation Time:  31

The patient was continuously monitored during the procedure by the
interventional radiology nurse under my direct supervision.

COMPLICATIONS:
None immediate.
PROCEDURE:
The lower abdomen was prepped with ChloraPrep in a sterile fashion,
and a sterile drape was applied covering the operative field. A
sterile gown and sterile gloves were used for the procedure. Local
anesthesia was provided with 1% Lidocaine.

Under CT guidance, an 18 gauge needle was inserted into the right
lower quadrant abscess and removed over an Amplatz wire. A 12 French
dilator followed by a 12 French drain were inserted. It was looped
and string fixed. Frank pus was aspirated.

Under CT guidance, an 18 gauge needle was inserted into the right
adnexal cyst. Clear yellow fluid was aspirated. Samples were sent
for culture.
FINDINGS: Imaging documents drain placement into a right lower quadrant
abscess and needle placement into a right adnexal cyst.
IMPRESSION: Successful right lower quadrant abscess strain yielding pus

Successful right adnexal cyst aspiration yielding clear yellow
fluid. Sample was sent for culture.

## 2018-08-03 IMAGING — US US TRANSVAGINAL NON-OB
1 series · 13 of 25 positions shown · non-contrast
Comparison: CT 03/11/2016.

CLINICAL DATA: Pelvic abscess.

EXAM:
TRANSABDOMINAL AND TRANSVAGINAL ULTRASOUND OF PELVIS
TECHNIQUE: Both transabdominal and transvaginal ultrasound examinations of the
pelvis were performed. Transabdominal technique was performed for
global imaging of the pelvis including uterus, ovaries, adnexal
regions, and pelvic cul-de-sac. It was necessary to proceed with
endovaginal exam following the transabdominal exam to visualize the
uterus and ovaries.

[Series 1: us transvaginal non-ob · 0.22mm/px · 113 acquisitions, 13 frames shown]
[im 1/113]
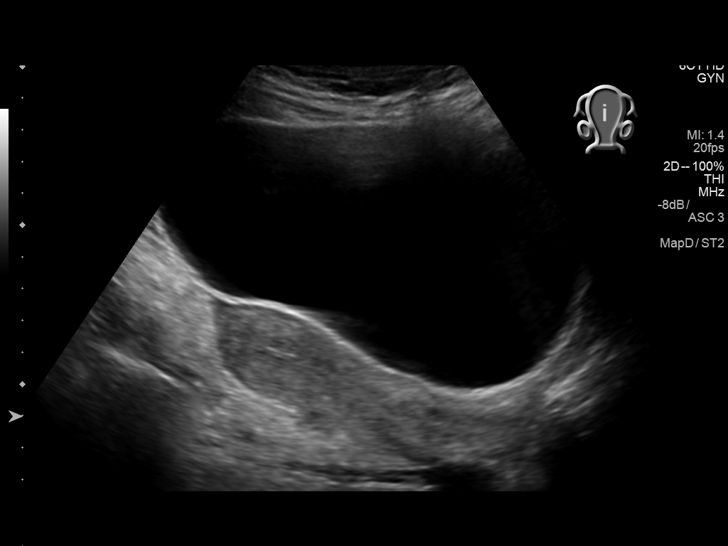
[im 10/113]
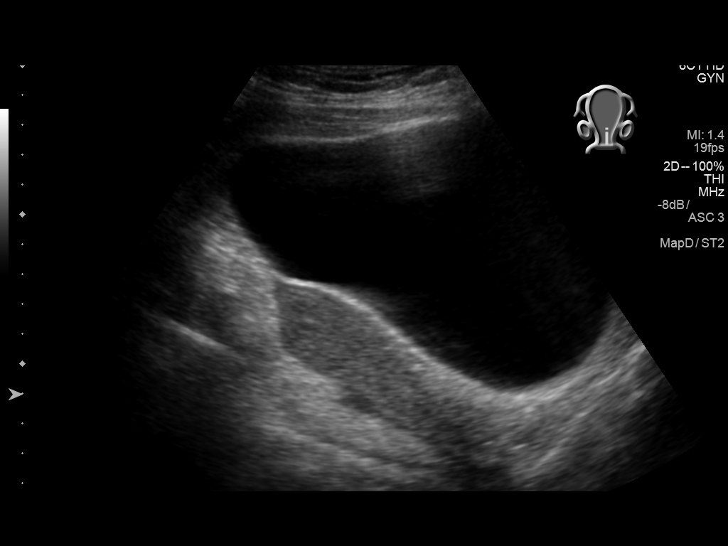
[im 19/113]
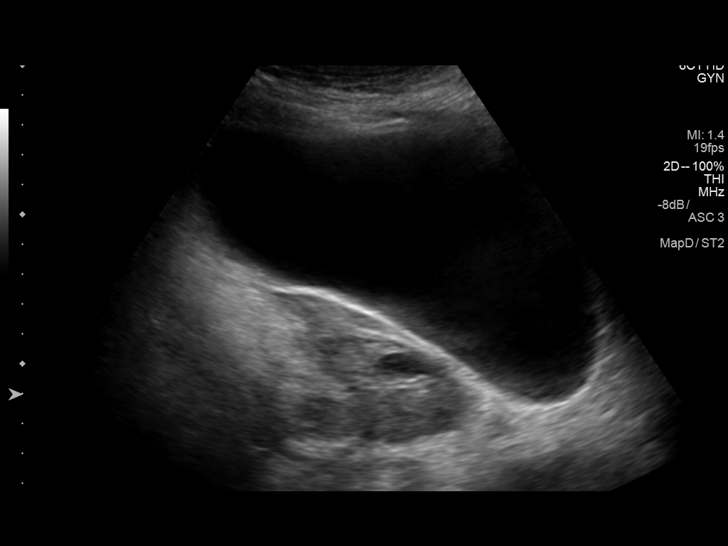
[im 29/113]
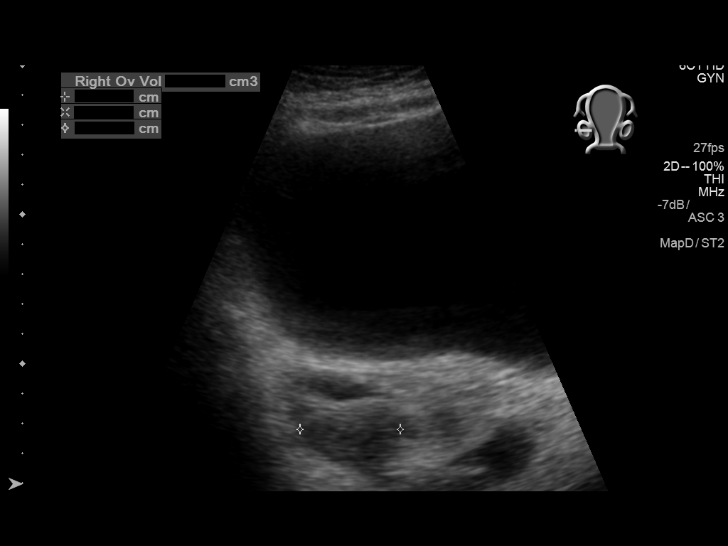
[im 38/113]
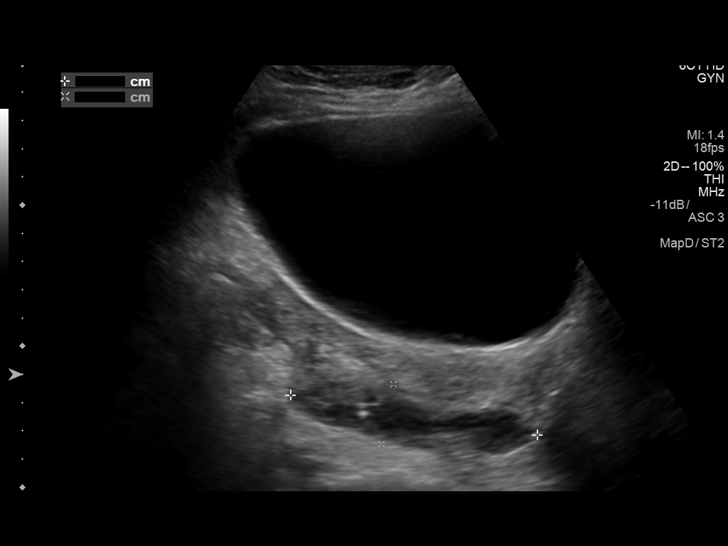
[im 47/113]
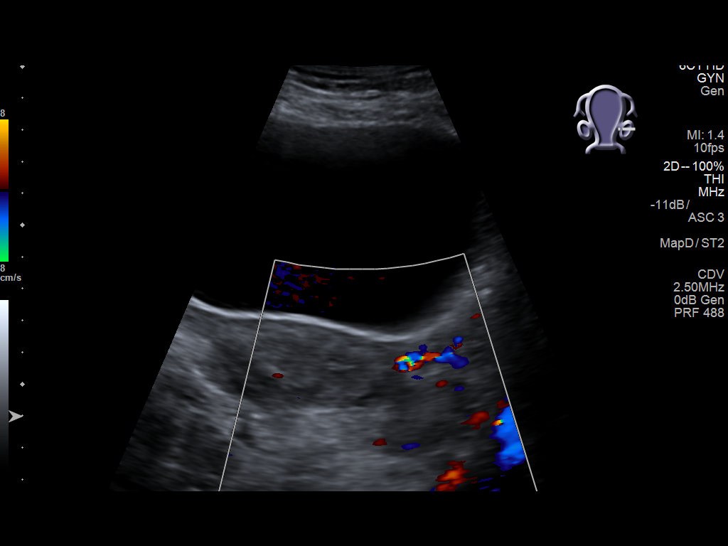
[im 57/113]
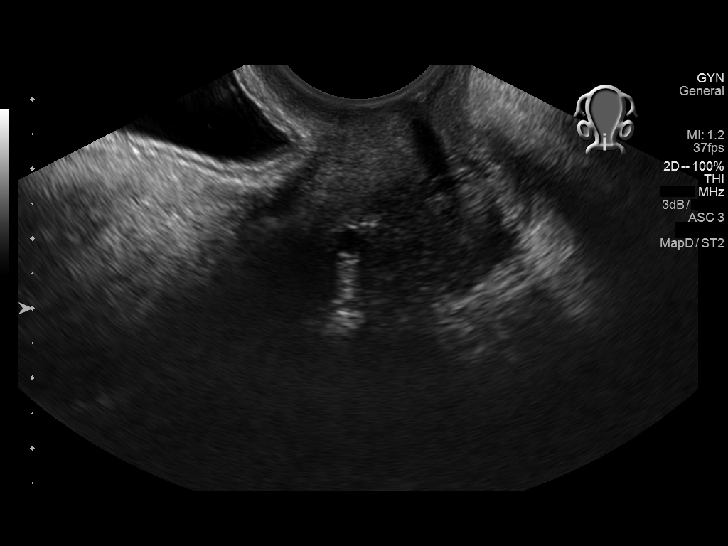
[im 66/113]
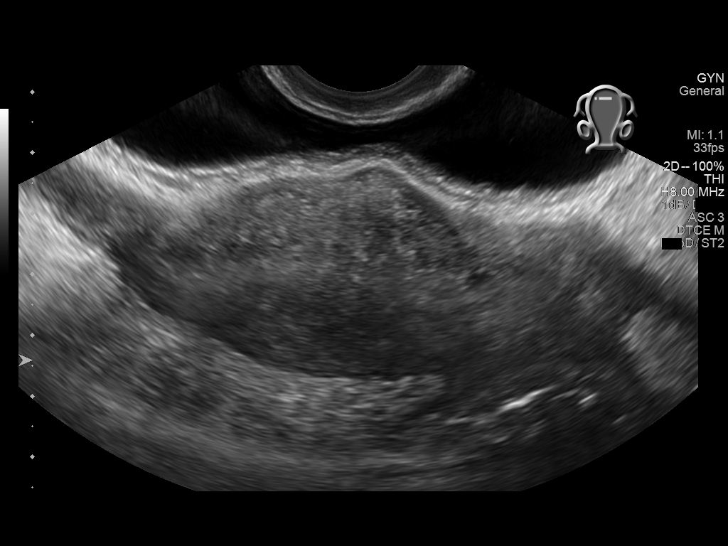
[im 75/113]
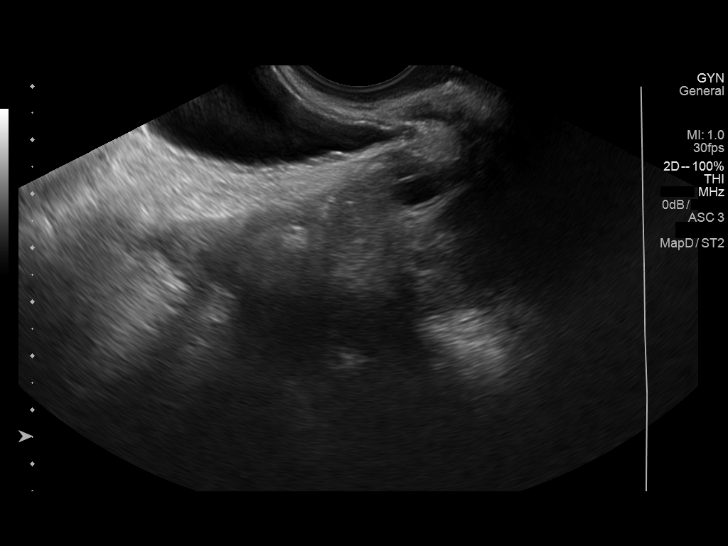
[im 85/113]
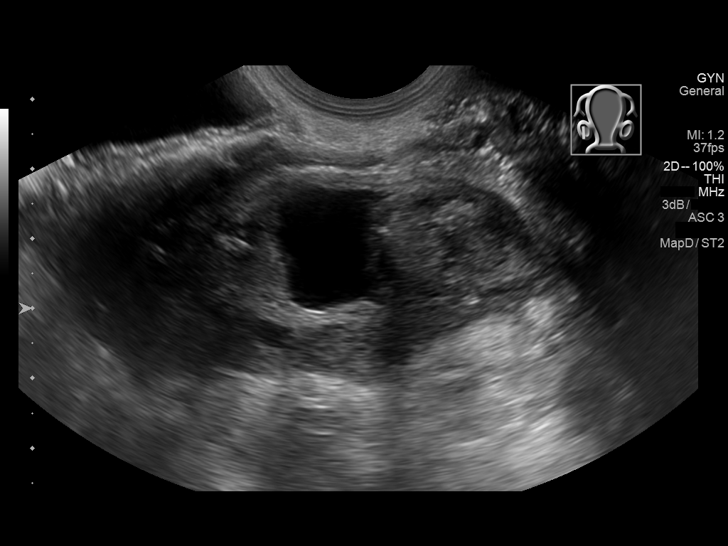
[im 94/113]
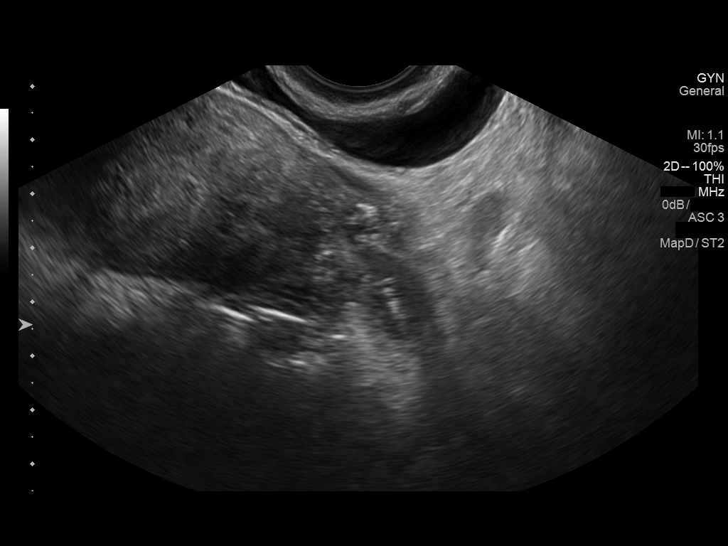
[im 103/113]
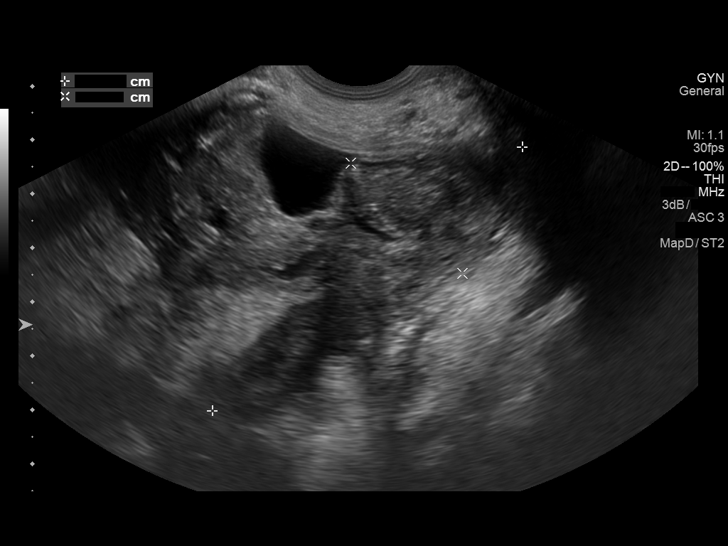
[im 113/113]
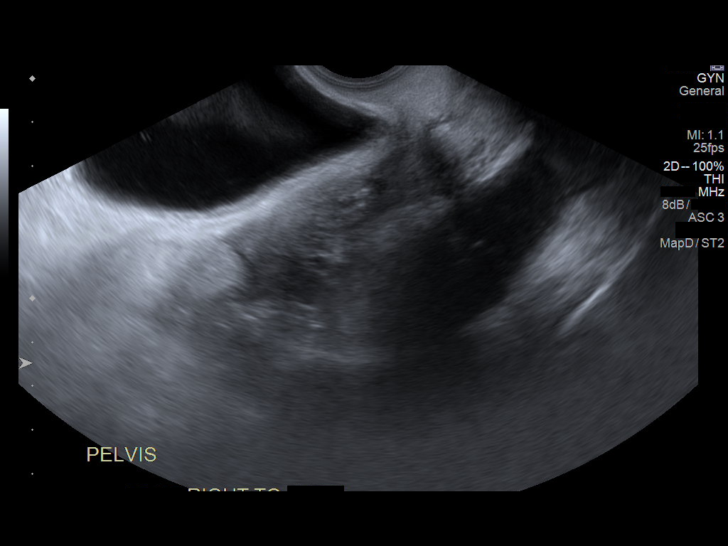

[13 of 25 positions shown; findings below may reference images not displayed]

FINDINGS: Uterus

Measurements: 10.6 x 2.7 x 5.2 cm. No fibroids or other mass
visualized.

Endometrium

Thickness: 3.8 mm.  No focal abnormality visualized.

Right ovary

Measurements: 3.8 x 2.5 x 3.8 cm. 3.2 x 1.5 x 1.4 cm simple
appearing cyst in the right ovary.

Left ovary

Measurements: 2.3 x 1.3 x 2.1 cm. Normal appearance/no adnexal mass.

Other findings

Ill-defined heterogeneous complex masses are noted in the pelvis,
the largest on the right measures 7.5 cm in maximum diameter, the
largest on the left/ cul de sac measures 7.0 cm in maximum diameter.
These could represent previously identified pelvic abscesses .
IMPRESSION: 1. Large complex masses are noted in pelvis as described above.
These could represent previously identified pelvic abscesses. These
can be followed with serial CTs.

2.  3.2 cm simple appearing cyst right ovary.

## 2023-02-06 ENCOUNTER — Encounter: Payer: Self-pay | Admitting: Pediatrics

## 2023-02-07 DIAGNOSIS — M542 Cervicalgia: Secondary | ICD-10-CM | POA: Diagnosis not present

## 2023-02-07 DIAGNOSIS — R0789 Other chest pain: Secondary | ICD-10-CM | POA: Diagnosis not present

## 2023-02-07 DIAGNOSIS — E079 Disorder of thyroid, unspecified: Secondary | ICD-10-CM | POA: Diagnosis not present

## 2023-02-07 DIAGNOSIS — Z7901 Long term (current) use of anticoagulants: Secondary | ICD-10-CM | POA: Diagnosis not present

## 2023-02-07 DIAGNOSIS — Z041 Encounter for examination and observation following transport accident: Secondary | ICD-10-CM | POA: Diagnosis not present

## 2023-02-07 DIAGNOSIS — M25571 Pain in right ankle and joints of right foot: Secondary | ICD-10-CM | POA: Diagnosis not present

## 2023-02-07 DIAGNOSIS — M950 Acquired deformity of nose: Secondary | ICD-10-CM | POA: Diagnosis not present

## 2023-02-07 DIAGNOSIS — R079 Chest pain, unspecified: Secondary | ICD-10-CM | POA: Diagnosis not present

## 2023-04-15 ENCOUNTER — Other Ambulatory Visit: Payer: Self-pay

## 2023-04-15 DIAGNOSIS — Z1231 Encounter for screening mammogram for malignant neoplasm of breast: Secondary | ICD-10-CM

## 2023-05-05 ENCOUNTER — Ambulatory Visit: Payer: Self-pay

## 2023-06-30 ENCOUNTER — Ambulatory Visit
Admission: RE | Admit: 2023-06-30 | Discharge: 2023-06-30 | Disposition: A | Discharge status: Home / Self Care | Payer: Self-pay | Source: Ambulatory Visit | Attending: Obstetrics and Gynecology | Admitting: Obstetrics and Gynecology

## 2023-06-30 ENCOUNTER — Ambulatory Visit: Payer: Self-pay | Attending: Hematology and Oncology | Admitting: Hematology and Oncology

## 2023-06-30 VITALS — BP 100/72 | Wt 137.4 lb

## 2023-06-30 DIAGNOSIS — Z1231 Encounter for screening mammogram for malignant neoplasm of breast: Secondary | ICD-10-CM

## 2023-06-30 NOTE — Progress Notes (Signed)
[name] is a 42 y.o. female who presents to [facility] clinic today with no complaints.    Pap Smear: Pap not smear completed today. Last Pap smear was 10/16/2020 at [facility] clinic and was normal. Per patient has no history of an abnormal Pap smear. Last Pap smear result is available in Epic.   Physical exam: Breasts Breasts symmetrical. No skin abnormalities bilateral breasts. No nipple retraction bilateral breasts. No nipple discharge bilateral breasts. No lymphadenopathy. No lumps palpated bilateral breasts.       Pelvic/Bimanual Pap is not indicated today    Smoking History: Patient has never smoked and was not referred to quit line.    Patient Navigation: Patient education provided. Access to services provided for patient through [facility] program. Aliene Beams interpreter provided. No transportation provided   Colorectal Cancer Screening: Per patient has never had colonoscopy completed No complaints today.    Breast and Cervical Cancer Risk Assessment: Patient does not have family history of breast cancer, known genetic mutations, or radiation treatment to the chest before age 69. Patient does not have history of cervical dysplasia, immunocompromised, or DES exposure in-utero.  Risk Scores as of Encounter on 06/30/2023     Pamela [name]           5-year 0.58%   Lifetime 8.04%   This patient is [place] but has no documented birth country, so the [place] model used data from [place] patients to calculate their risk score. Document a birth country in the Demographics activity for a more accurate score.         Last calculated by Narda Rutherford, LPN on 16/04/9603 at  3:49 PM          A: [facility]CP exam without pap smear No complaints with benign exam.  P: Referred patient to the [facility] for a screening mammogram. Appointment scheduled 06/30/2023.  Pascal Lux, NP 06/30/2023 3:35 PM

## 2023-06-30 NOTE — Patient Instructions (Signed)
Taught Pamela [name] about self breast awareness and gave educational materials to take home. Patient did not need a Pap smear today due to last Pap smear was in 10/16/2020 per patient. Let her know [facility] will cover Pap smears every 5 years unless has a history of abnormal Pap smears. Referred patient to the [facility] for screening mammogram. Appointment scheduled for 06/30/2023. Patient aware of appointment and will be there. Let patient know will follow up with her within the next couple weeks with results. [facility] verbalized understanding.  Pascal Lux, NP 3:37 PM

## 2023-10-21 ENCOUNTER — Encounter: Payer: Self-pay | Admitting: Emergency Medicine

## 2023-10-21 ENCOUNTER — Other Ambulatory Visit: Payer: Self-pay

## 2023-10-21 ENCOUNTER — Emergency Department
Admission: EM | Admit: 2023-10-21 | Discharge: 2023-10-21 | Disposition: A | Discharge status: Home / Self Care | Payer: Self-pay | Attending: Emergency Medicine | Admitting: Emergency Medicine

## 2023-10-21 DIAGNOSIS — Z2914 Encounter for prophylactic rabies immune globin: Secondary | ICD-10-CM | POA: Insufficient documentation

## 2023-10-21 DIAGNOSIS — Z203 Contact with and (suspected) exposure to rabies: Secondary | ICD-10-CM | POA: Insufficient documentation

## 2023-10-21 DIAGNOSIS — W540XXA Bitten by dog, initial encounter: Secondary | ICD-10-CM | POA: Insufficient documentation

## 2023-10-21 DIAGNOSIS — Z23 Encounter for immunization: Secondary | ICD-10-CM | POA: Diagnosis not present

## 2023-10-21 DIAGNOSIS — S71151A Open bite, right thigh, initial encounter: Secondary | ICD-10-CM | POA: Insufficient documentation

## 2023-10-21 MED ORDER — RABIES VACCINE, PCEC IM SUSR
1.0000 mL | Freq: Once | INTRAMUSCULAR | Status: AC
  Administered 2023-10-21: 1 mL via INTRAMUSCULAR
  Filled 2023-10-21: qty 1

## 2023-10-21 MED ORDER — IBUPROFEN 800 MG PO TABS: 800.0000 mg | ORAL_TABLET | Freq: Three times a day (TID) | ORAL | 0 refills | Status: AC | PRN

## 2023-10-21 MED ORDER — IBUPROFEN 800 MG PO TABS
800.0000 mg | ORAL_TABLET | Freq: Once | ORAL | Status: AC
  Administered 2023-10-21: 800 mg via ORAL
  Filled 2023-10-21: qty 1

## 2023-10-21 MED ORDER — TETANUS-DIPHTH-ACELL PERTUSSIS 5-2.5-18.5 LF-MCG/0.5 IM SUSY
0.5000 mL | PREFILLED_SYRINGE | Freq: Once | INTRAMUSCULAR | Status: AC
  Administered 2023-10-21: 0.5 mL via INTRAMUSCULAR
  Filled 2023-10-21: qty 0.5

## 2023-10-21 MED ORDER — AMOXICILLIN-POT CLAVULANATE 875-125 MG PO TABS: 1.0000 | ORAL_TABLET | Freq: Two times a day (BID) | ORAL | 0 refills | Status: AC

## 2023-10-21 MED ORDER — RABIES IMMUNE GLOBULIN 150 UNIT/ML IM INJ
20.0000 [IU]/kg | INJECTION | Freq: Once | INTRAMUSCULAR | Status: AC
  Administered 2023-10-21: 1200 [IU]
  Filled 2023-10-21: qty 10

## 2023-10-21 MED ORDER — LIDOCAINE HCL (PF) 1 % IJ SOLN
5.0000 mL | Freq: Once | INTRAMUSCULAR | Status: AC
  Administered 2023-10-21: 5 mL
  Filled 2023-10-21: qty 5

## 2023-10-21 NOTE — ED Triage Notes (Signed)
 Patient to ED via [facility] from home after being bit by a pitbull on her right thigh. Bleeding controlled. Unknown vaccination of animal.

## 2023-10-21 NOTE — ED Provider Notes (Signed)
 [facility] Emergency Department Provider Note     Event Date/Time   First MD Initiated Contact with Patient 10/21/23 1347     (approximate)   History   Animal Bite   HPI  Pamela [name] is a 43 y.o. female with no significant past medical history presents to the ED for evaluation of a dog bite to her right thigh.  Patient reports unknown rabies status of the dog who bit her.  Unknown tetanus status.  No other complaints.     Physical Exam   Triage Vital Signs: ED Triage Vitals  Encounter Vitals Group     BP 10/21/23 1343 100/68     Systolic BP Percentile --      Diastolic BP Percentile --      Pulse Rate 10/21/23 1343 77     Resp 10/21/23 1343 17     Temp 10/21/23 1343 98 F (36.7 C)     Temp Source 10/21/23 1343 Oral     SpO2 10/21/23 1343 100 %     Weight --      Height --      Head Circumference --      Peak Flow --      Pain Score 10/21/23 1344 6     Pain Loc --      Pain Education --      Exclude from Growth Chart --     Most recent vital signs: Vitals:   10/21/23 1343  BP: 100/68  Pulse: 77  Resp: 17  Temp: 98 F (36.7 C)  SpO2: 100%    General Awake, no distress.  HEENT NCAT. PERRL. EOMI. CV:  Good peripheral perfusion.  RESP:  Normal effort.  ABD:  No distention.  Other:  Right thigh reveals a 1.5 cm x 1.5 cm open gaped dog bite with a < 1 cm puncture wound adjacent medially.  There is a linear abrasion on the anterior medial aspect of the distal thigh.  Superficial in nature.  Neurovascular status intact all throughout.   ED Results / Procedures / Treatments   Labs (all labs ordered are listed, but only abnormal results are displayed) Labs Reviewed - No data to display  No results found.  PROCEDURES:  Critical Care performed: No  .Laceration Repair  Date/Time: 10/21/2023 4:43 PM  Performed by: Kern Reap A, PA-C Authorized by: Kern Reap A, PA-C   Consent:    Consent obtained:  Verbal    Consent given by:  Patient   Risks discussed:  Infection, pain, poor cosmetic result and poor wound healing Anesthesia:    Anesthesia method:  Local infiltration   Local anesthetic:  Lidocaine 1% w/o epi Laceration details:    Location:  Leg   Leg location:  R upper leg   Length (cm):  1.5   Depth (mm):  3 Treatment:    Area cleansed with:  Povidone-iodine and chlorhexidine   Amount of cleaning:  Extensive   Irrigation solution:  Sterile saline   Irrigation method:  Pressure wash Skin repair:    Repair method:  Sutures   Suture size:  4-0   Suture material:  Nylon   Suture technique:  Simple interrupted   Number of sutures:  3 Approximation:    Approximation:  Loose Repair type:    Repair type:  Simple Post-procedure details:    Dressing:  Non-adherent dressing   Procedure completion:  Tolerated    MEDICATIONS ORDERED IN ED: Medications  rabies  immune globulin (HYPERRAB/KEDRAB) injection 1,200 Units (has no administration in time range)  lidocaine (PF) (XYLOCAINE) 1 % injection 5 mL (5 mLs Infiltration Given by Other 10/21/23 1538)  Tdap (BOOSTRIX) injection 0.5 mL (0.5 mLs Intramuscular Given 10/21/23 1621)  rabies vaccine (RABAVERT) injection 1 mL (1 mL Intramuscular Given 10/21/23 1613)  ibuprofen (ADVIL) tablet 800 mg (800 mg Oral Given 10/21/23 1632)     IMPRESSION / MDM / ASSESSMENT AND PLAN / ED COURSE  I reviewed the triage vital signs and the nursing notes.                               43 y.o. female presents to the emergency department for evaluation and treatment of dog bite. See HPI for further details.   Differential diagnosis includes, but is not limited to dog bite, laceration, tetanus, rabies  Patient's presentation is most consistent with acute complicated illness / injury requiring diagnostic workup.  Patient is alert and oriented.  Please see procedure note.  3 sutures placed loosely over open gaped dog bite.  Area thoroughly cleansed.  Tetanus up  dated.  Discussed thoroughly of the rabies vaccine.  Patient is requesting for full vaccination.  Elevation education provided.  Patient is in stable condition for discharge home.  She is encouraged to return on day 3 for her second rabies vaccine.  She is also encouraged to return in 7 to 10 days for suture removal.  Patient stable condition for discharge.  ED return precautions thoroughly discussed and monitoring for signs of infection.   FINAL CLINICAL IMPRESSION(S) / ED DIAGNOSES   Final diagnoses:  Dog bite, initial encounter     Rx / DC Orders   ED Discharge Orders          Ordered    amoxicillin-clavulanate (AUGMENTIN) 875-125 MG tablet  2 times daily        10/21/23 1633    ibuprofen (ADVIL) 800 MG tablet  Every 8 hours PRN        10/21/23 1633             Note:  This document was prepared using Dragon voice recognition software and may include unintentional dictation errors.    Romeo Apple, Janalee Grobe A, PA-C 10/21/23 1644    Chesley Noon, MD 10/22/23 1504

## 2023-10-21 NOTE — Discharge Instructions (Addendum)
 Your evaluated in the ED for a dog bite to the right thigh.  Your tetanus was updated.  Given that you did not know the status of the dog's rabies vaccination you did receive the rabies vaccine during this visit.  You will need to return to the ED for the remainder of your rabies vaccine on days 3, 7 and 14.  Below are the dates in which you should return:  Day 3 - 04/11 Day 7 - 04/15 ( SUTURE REMOVAL IF WOUND is HEALED) Day 14 - 04/22  you have been prescribed antibiotics, take them exactly as directed. Do not stop taking them because your symptoms have improved.   Keep sutures dry for first 24 hrs. Keep suture site of clean & dry. Gently use soap & water after first 24 hrs. DO NOT USE alcohol, hydrogen peroxide etc, to clean skin. You may cover the incision with clean gauze & replace it after your daily shower for your comfort.

## 2023-10-21 NOTE — ED Notes (Signed)
 Dog bite to L thigh at 123-pm today. Known dog, unknown rabies status. Bite is wrapped. Spanish speaking.

## 2023-10-21 NOTE — ED Notes (Signed)
 Provided dressing to wounds.

## 2023-10-24 ENCOUNTER — Emergency Department
Admission: EM | Admit: 2023-10-24 | Discharge: 2023-10-25 | Disposition: A | Discharge status: Home / Self Care | Attending: Emergency Medicine | Admitting: Emergency Medicine

## 2023-10-24 ENCOUNTER — Other Ambulatory Visit: Payer: Self-pay

## 2023-10-24 DIAGNOSIS — Z23 Encounter for immunization: Secondary | ICD-10-CM | POA: Insufficient documentation

## 2023-10-24 MED ORDER — RABIES VACCINE, PCEC IM SUSR
1.0000 mL | Freq: Once | INTRAMUSCULAR | Status: AC
  Administered 2023-10-25: 1 mL via INTRAMUSCULAR
  Filled 2023-10-24: qty 1

## 2023-10-24 NOTE — ED Triage Notes (Signed)
 Pt presents to ER to get rabies vaccine. Pt reports she was bitten by a dog 04/08. Pt talks in complete sentences no respiratory distress noted

## 2023-10-25 NOTE — Discharge Instructions (Addendum)
 Return to the ER per your schedule that you were previously given to continue rabies series, or for any other new or concerning symptoms.

## 2023-10-25 NOTE — ED Provider Notes (Signed)
   [facility] Provider Note    Event Date/Time   First MD Initiated Contact with Patient 10/24/23 2309     (approximate)   History   Rabies Vaccine    HPI  Pamela [name] is a 43 year old female presenting to the emergency department for rabies vaccination.  Seen on 4/8 after a dog bite in our ER.  Returns for second dose of rabies series.  Denies other complaints.     Physical Exam   Triage Vital Signs: ED Triage Vitals  Encounter Vitals Group     BP 10/24/23 2230 113/69     Systolic BP Percentile --      Diastolic BP Percentile --      Pulse Rate 10/24/23 2230 65     Resp 10/24/23 2230 16     Temp 10/24/23 2230 97.9 F (36.6 C)     Temp Source 10/24/23 2230 Oral     SpO2 10/24/23 2230 100 %     Weight 10/24/23 2231 135 lb (61.2 kg)     Height 10/24/23 2231 5\' 1"  (1.549 m)     Head Circumference --      Peak Flow --      Pain Score 10/24/23 2230 0     Pain Loc --      Pain Education --      Exclude from Growth Chart --     Most recent vital signs: Vitals:   10/24/23 2230  BP: 113/69  Pulse: 65  Resp: 16  Temp: 97.9 F (36.6 C)  SpO2: 100%     General: Awake, interactive  CV:  Regular rate, good peripheral perfusion.  Resp:  Unlabored respirations.  Abd:  Nondistended.  Neuro:  Symmetric facial movement, fluid speech MSK:  Well-healing area of her right thigh from prior dog bite.  Sutures in place, planned removal on day 7   ED Results / Procedures / Treatments   Labs (all labs ordered are listed, but only abnormal results are displayed) Labs Reviewed - No data to display   EKG EKG independently reviewed interpreted by myself (ER attending) demonstrates:    RADIOLOGY Imaging independently reviewed and interpreted by myself demonstrates:   No results found.  PROCEDURES:  Critical Care performed: No  Procedures   MEDICATIONS ORDERED IN ED: Medications  rabies vaccine (RABAVERT) injection 1 mL (has no  administration in time range)     IMPRESSION / MDM / ASSESSMENT AND PLAN / ED COURSE  I reviewed the triage vital signs and the nursing notes.  Differential diagnosis includes, but is not limited to, planned rabies vaccination, no evidence of cellulitis or other complication from recent wound  Patient's presentation is most consistent with acute, uncomplicated illness.  43 year old presenting for rabies vaccination.  Second dose of rabies series ordered.  Patient has scheduled for continued vaccination series.  Discharged in stable condition.      FINAL CLINICAL IMPRESSION(S) / ED DIAGNOSES   Final diagnoses:  Encounter for repeat administration of rabies vaccination     Rx / DC Orders   ED Discharge Orders     None        Note:  This document was prepared using Dragon voice recognition software and may include unintentional dictation errors.   Claria Crofts, MD 10/25/23 [phone]

## 2023-10-29 ENCOUNTER — Other Ambulatory Visit: Payer: Self-pay

## 2023-10-29 ENCOUNTER — Emergency Department
Admission: EM | Admit: 2023-10-29 | Discharge: 2023-10-29 | Disposition: A | Discharge status: Home / Self Care | Attending: Emergency Medicine | Admitting: Emergency Medicine

## 2023-10-29 DIAGNOSIS — Z4802 Encounter for removal of sutures: Secondary | ICD-10-CM | POA: Insufficient documentation

## 2023-10-29 NOTE — ED Provider Notes (Signed)
   [facility] Provider Note    Event Date/Time   First MD Initiated Contact with Patient 10/29/23 [phone]     (approximate)   History   Suture / Staple Removal   HPI  Pamela [name] is a 43 y.o. female  with no significant past medical history and as listed in EMR presents to the emergency department for suture removal. Sutures inserted here on 10/21/23 after dog bite. She denies complaint or concern.      Physical Exam   Triage Vital Signs: ED Triage Vitals  Encounter Vitals Group     BP 10/29/23 0947 114/87     Systolic BP Percentile --      Diastolic BP Percentile --      Pulse Rate 10/29/23 0947 68     Resp 10/29/23 0947 18     Temp 10/29/23 0947 97.6 F (36.4 C)     Temp Source 10/29/23 0947 Oral     SpO2 10/29/23 0947 100 %     Weight 10/29/23 0947 134 lb 14.7 oz (61.2 kg)     Height 10/29/23 0955 5\' 1"  (1.549 m)     Head Circumference --      Peak Flow --      Pain Score 10/29/23 0947 0     Pain Loc --      Pain Education --      Exclude from Growth Chart --     Most recent vital signs: Vitals:   10/29/23 0947  BP: 114/87  Pulse: 68  Resp: 18  Temp: 97.6 F (36.4 C)  SpO2: 100%    General: Awake, no distress.  CV:  Good peripheral perfusion.  Resp:  Normal effort.  Abd:  No distention.  Other:  Well healed laceration to right thigh.   ED Results / Procedures / Treatments   Labs (all labs ordered are listed, but only abnormal results are displayed) Labs Reviewed - No data to display   EKG     RADIOLOGY  Image and radiology report reviewed and interpreted by me. Radiology report consistent with the same.    PROCEDURES:  Critical Care performed: No  Procedures   MEDICATIONS ORDERED IN ED:  Medications - No data to display   IMPRESSION / MDM / ASSESSMENT AND PLAN / ED COURSE   I have reviewed the triage note.  Differential diagnosis includes, but is not limited to, suture removal, well healed  laceration, cellulitis  Patient's presentation is most consistent with acute, uncomplicated illness.  43 year old female presents for suture removal.  3 sutures were removed. Wound is well healed and well approximated.   Patient denies questions or concerns.      FINAL CLINICAL IMPRESSION(S) / ED DIAGNOSES   Final diagnoses:  Visit for suture removal     Rx / DC Orders   ED Discharge Orders     None        Note:  This document was prepared using Dragon voice recognition software and may include unintentional dictation errors.   Sherryle Don, FNP 10/29/23 1015    Bradler, Evan K, MD 10/30/23 [phone]

## 2023-10-29 NOTE — ED Triage Notes (Signed)
 Presents for suture removal from right thigh.  Wound well approximated, clean and dry. No  drainage.

## 2023-10-29 NOTE — ED Notes (Signed)
 Pt left without receiving discharge paperwork at this time
